# Patient Record
Sex: Female | Born: 1950 | Race: White | Hispanic: No | Marital: Married | Smoking: Former smoker
Health system: Southern US, Community
[De-identification: ages and names within clinical notes are randomized; demographics above are authoritative.]

## PROBLEM LIST (undated history)

## (undated) DIAGNOSIS — G43909 Migraine, unspecified, not intractable, without status migrainosus: Secondary | ICD-10-CM

## (undated) DIAGNOSIS — F32A Depression, unspecified: Secondary | ICD-10-CM

## (undated) DIAGNOSIS — I10 Essential (primary) hypertension: Secondary | ICD-10-CM

## (undated) DIAGNOSIS — Z8711 Personal history of peptic ulcer disease: Secondary | ICD-10-CM

## (undated) DIAGNOSIS — F329 Major depressive disorder, single episode, unspecified: Secondary | ICD-10-CM

## (undated) DIAGNOSIS — Z8719 Personal history of other diseases of the digestive system: Secondary | ICD-10-CM

## (undated) DIAGNOSIS — I639 Cerebral infarction, unspecified: Secondary | ICD-10-CM

## (undated) DIAGNOSIS — M1990 Unspecified osteoarthritis, unspecified site: Secondary | ICD-10-CM

## (undated) HISTORY — PX: ABDOMINAL HYSTERECTOMY: SHX81

---

## 2016-08-17 ENCOUNTER — Encounter (HOSPITAL_BASED_OUTPATIENT_CLINIC_OR_DEPARTMENT_OTHER): Payer: Self-pay | Admitting: Emergency Medicine

## 2016-08-17 ENCOUNTER — Emergency Department (HOSPITAL_BASED_OUTPATIENT_CLINIC_OR_DEPARTMENT_OTHER): Payer: Medicare HMO

## 2016-08-17 ENCOUNTER — Emergency Department (HOSPITAL_BASED_OUTPATIENT_CLINIC_OR_DEPARTMENT_OTHER)
Admission: EM | Admit: 2016-08-17 | Discharge: 2016-08-18 | Disposition: A | Discharge status: Home / Self Care | Payer: Medicare HMO | Attending: Emergency Medicine | Admitting: Emergency Medicine

## 2016-08-17 DIAGNOSIS — R109 Unspecified abdominal pain: Secondary | ICD-10-CM | POA: Diagnosis present

## 2016-08-17 DIAGNOSIS — E876 Hypokalemia: Secondary | ICD-10-CM | POA: Diagnosis not present

## 2016-08-17 DIAGNOSIS — Z87891 Personal history of nicotine dependence: Secondary | ICD-10-CM | POA: Insufficient documentation

## 2016-08-17 DIAGNOSIS — K5900 Constipation, unspecified: Secondary | ICD-10-CM | POA: Insufficient documentation

## 2016-08-17 DIAGNOSIS — E860 Dehydration: Secondary | ICD-10-CM | POA: Diagnosis not present

## 2016-08-17 DIAGNOSIS — I10 Essential (primary) hypertension: Secondary | ICD-10-CM | POA: Insufficient documentation

## 2016-08-17 DIAGNOSIS — Z7982 Long term (current) use of aspirin: Secondary | ICD-10-CM | POA: Diagnosis not present

## 2016-08-17 DIAGNOSIS — R1010 Upper abdominal pain, unspecified: Secondary | ICD-10-CM

## 2016-08-17 DIAGNOSIS — Z79899 Other long term (current) drug therapy: Secondary | ICD-10-CM | POA: Insufficient documentation

## 2016-08-17 HISTORY — DX: Unspecified osteoarthritis, unspecified site: M19.90

## 2016-08-17 HISTORY — DX: Migraine, unspecified, not intractable, without status migrainosus: G43.909

## 2016-08-17 HISTORY — DX: Depression, unspecified: F32.A

## 2016-08-17 HISTORY — DX: Cerebral infarction, unspecified: I63.9

## 2016-08-17 HISTORY — DX: Personal history of peptic ulcer disease: Z87.11

## 2016-08-17 HISTORY — DX: Essential (primary) hypertension: I10

## 2016-08-17 HISTORY — DX: Personal history of other diseases of the digestive system: Z87.19

## 2016-08-17 HISTORY — DX: Major depressive disorder, single episode, unspecified: F32.9

## 2016-08-17 LAB — CBC WITH DIFFERENTIAL/PLATELET
BASOS ABS: 0 10*3/uL (ref 0.0–0.1)
Basophils Relative: 0 %
EOS PCT: 4 %
Eosinophils Absolute: 0.2 10*3/uL (ref 0.0–0.7)
HCT: 36 % (ref 36.0–46.0)
Hemoglobin: 12.1 g/dL (ref 12.0–15.0)
LYMPHS PCT: 26 %
Lymphs Abs: 1.7 10*3/uL (ref 0.7–4.0)
MCH: 32.9 pg (ref 26.0–34.0)
MCHC: 33.6 g/dL (ref 30.0–36.0)
MCV: 97.8 fL (ref 78.0–100.0)
Monocytes Absolute: 0.7 10*3/uL (ref 0.1–1.0)
Monocytes Relative: 11 %
NEUTROS PCT: 59 %
Neutro Abs: 3.8 10*3/uL (ref 1.7–7.7)
PLATELETS: 211 10*3/uL (ref 150–400)
RBC: 3.68 MIL/uL — AB (ref 3.87–5.11)
RDW: 13.2 % (ref 11.5–15.5)
WBC: 6.4 10*3/uL (ref 4.0–10.5)

## 2016-08-17 LAB — COMPREHENSIVE METABOLIC PANEL
ALT: 26 U/L (ref 14–54)
AST: 25 U/L (ref 15–41)
Albumin: 3.6 g/dL (ref 3.5–5.0)
Alkaline Phosphatase: 55 U/L (ref 38–126)
Anion gap: 8 (ref 5–15)
BUN: 35 mg/dL — ABNORMAL HIGH (ref 6–20)
CHLORIDE: 105 mmol/L (ref 101–111)
CO2: 23 mmol/L (ref 22–32)
CREATININE: 1.79 mg/dL — AB (ref 0.44–1.00)
Calcium: 8.7 mg/dL — ABNORMAL LOW (ref 8.9–10.3)
GFR calc Af Amer: 33 mL/min — ABNORMAL LOW (ref >60)
GFR, EST NON AFRICAN AMERICAN: 28 mL/min — AB (ref >60)
Glucose, Bld: 97 mg/dL (ref 65–99)
Potassium: 3.1 mmol/L — ABNORMAL LOW (ref 3.5–5.1)
SODIUM: 136 mmol/L (ref 135–145)
Total Bilirubin: 0.4 mg/dL (ref 0.3–1.2)
Total Protein: 6.5 g/dL (ref 6.5–8.1)

## 2016-08-17 LAB — URINALYSIS, ROUTINE W REFLEX MICROSCOPIC
Bilirubin Urine: NEGATIVE
Glucose, UA: NEGATIVE mg/dL
HGB URINE DIPSTICK: NEGATIVE
Ketones, ur: NEGATIVE mg/dL
LEUKOCYTES UA: NEGATIVE
Nitrite: NEGATIVE
PROTEIN: NEGATIVE mg/dL
Specific Gravity, Urine: 1.02 (ref 1.005–1.030)
pH: 6 (ref 5.0–8.0)

## 2016-08-17 LAB — LIPASE, BLOOD: Lipase: 29 U/L (ref 11–51)

## 2016-08-17 MED ORDER — POTASSIUM CHLORIDE CRYS ER 20 MEQ PO TBCR
40.0000 meq | EXTENDED_RELEASE_TABLET | Freq: Once | ORAL | Status: AC
  Administered 2016-08-17: 40 meq via ORAL
  Filled 2016-08-17: qty 2

## 2016-08-17 MED ORDER — DICYCLOMINE HCL 10 MG PO CAPS
10.0000 mg | ORAL_CAPSULE | Freq: Once | ORAL | Status: AC
  Administered 2016-08-17: 10 mg via ORAL
  Filled 2016-08-17: qty 1

## 2016-08-17 MED ORDER — MORPHINE SULFATE (PF) 4 MG/ML IV SOLN
4.0000 mg | Freq: Once | INTRAVENOUS | Status: AC
  Administered 2016-08-17: 4 mg via INTRAVENOUS
  Filled 2016-08-17: qty 1

## 2016-08-17 MED ORDER — SODIUM CHLORIDE 0.9 % IV BOLUS (SEPSIS)
500.0000 mL | Freq: Once | INTRAVENOUS | Status: AC
  Administered 2016-08-17: 500 mL via INTRAVENOUS

## 2016-08-17 MED ORDER — POTASSIUM CHLORIDE 10 MEQ/100ML IV SOLN
10.0000 meq | INTRAVENOUS | Status: AC
  Administered 2016-08-17 (×2): 10 meq via INTRAVENOUS
  Filled 2016-08-17 (×2): qty 100

## 2016-08-17 MED ORDER — DICYCLOMINE HCL 20 MG PO TABS: 20.0000 mg | ORAL_TABLET | Freq: Two times a day (BID) | ORAL | 0 refills | Status: AC | PRN

## 2016-08-17 MED ORDER — POLYETHYLENE GLYCOL 3350 17 G PO PACK: 17.0000 g | PACK | Freq: Every day | ORAL | 0 refills | Status: AC

## 2016-08-17 MED ORDER — DOCUSATE SODIUM 100 MG PO CAPS: 100.0000 mg | ORAL_CAPSULE | Freq: Two times a day (BID) | ORAL | 0 refills | Status: AC | PRN

## 2016-08-17 NOTE — ED Triage Notes (Signed)
LLQ abd pain radiating to back since yesterday. Denies N/V. Pt states she woke up with blood in her mouth yesterday.

## 2016-08-17 NOTE — ED Provider Notes (Signed)
[facility]-EMERGENCY DEPT Provider Note   CSN: [id number] Arrival date & time: 08/17/16  2023  By signing my name below, I, Catherine Weiss, attest that this documentation has been prepared under the direction and in the presence of Loren Racer, MD. Electronically Signed: Sonum Weiss, Neurosurgeon. 08/17/16. 10:05 PM.  History   Chief Complaint Chief Complaint  Patient presents with  . Abdominal Pain    The history is provided by the patient. No language interpreter was used.    HPI Comments: Catherine Weiss is a 66 y.o. female who presents to the Emergency Department complaining of constant left sided abdominal pain with radiation to the left Flank that began yesterday around noon. She had associated subjective fever and chills that began yesterday. She notes bruising to the lower back area and had a small amount of blood in her mouth upon waking yesterday morning. She notes having blood in her mouth about 1 month ago as well. She also reports pain to the left lower leg for the past few days. She currently takes Plavix. She denies nausea, vomiting, constipation, diarrhea, blood in stool, dysuria, hematuria, urinary frequency, leg swelling.  She has had an appendectomy and hysterectomy. She denies history of kidney stones.   Past Medical History:  Diagnosis Date  . Arthritis   . Depression   . History of stomach ulcers   . Hypertension   . Migraines   . Stroke [facility])     There are no active problems to display for this patient.   Past Surgical History:  Procedure Laterality Date  . ABDOMINAL HYSTERECTOMY      OB History    No data available       Home Medications    Prior to Admission medications   Medication Sig Start Date End Date Taking? Authorizing Provider  aspirin EC 81 MG tablet Take 81 mg by mouth daily.   Yes Historical Provider, MD  clopidogrel (PLAVIX) 75 MG tablet Take 75 mg by mouth daily.   Yes Historical Provider, MD  PARoxetine (PAXIL) 20 MG tablet Take 20 mg by  mouth daily.   Yes Historical Provider, MD  topiramate (TOPAMAX) 100 MG tablet Take 100 mg by mouth 2 (two) times daily.   Yes Historical Provider, MD  dicyclomine (BENTYL) 20 MG tablet Take 1 tablet (20 mg total) by mouth 2 (two) times daily as needed for spasms. 08/17/16   Loren Racer, MD  docusate sodium (COLACE) 100 MG capsule Take 1 capsule (100 mg total) by mouth 2 (two) times daily as needed for mild constipation or moderate constipation. 08/17/16   Loren Racer, MD  polyethylene glycol [facility]) packet Take 17 g by mouth daily. 08/17/16   Loren Racer, MD    Family History No family history on file.  Social History Social History  Substance Use Topics  . Smoking status: Former Games developer  . Smokeless tobacco: Never Used  . Alcohol use Not on file     Allergies   Penicillins   Review of Systems Review of Systems  Constitutional: Positive for chills and fever.  Respiratory: Negative for cough and shortness of breath.   Cardiovascular: Negative for chest pain, palpitations and leg swelling.  Gastrointestinal: Positive for abdominal pain. Negative for blood in stool, constipation, diarrhea, nausea and vomiting.  Genitourinary: Positive for flank pain. Negative for dysuria, frequency and hematuria.  Musculoskeletal: Positive for back pain and myalgias.  Skin: Positive for color change (bruising). Negative for rash and wound.  Neurological: Negative for dizziness,  weakness, light-headedness, numbness and headaches.  All other systems reviewed and are negative.    Physical Exam Updated Vital Signs BP (!) 148/73 (BP Location: Right Arm)   Pulse 87   Temp 97.5 F (36.4 C) (Axillary)   Resp 16   SpO2 97%   Physical Exam  Constitutional: She is oriented to person, place, and time. She appears well-developed and well-nourished. She appears distressed.  HENT:  Head: Normocephalic and atraumatic.  Mouth/Throat: Oropharynx is clear and moist. No oropharyngeal  exudate.  Eyes: EOM are normal. Pupils are equal, round, and reactive to light.  Neck: Normal range of motion. Neck supple.  Cardiovascular: Normal rate and regular rhythm.   Pulmonary/Chest: Effort normal and breath sounds normal. No respiratory distress. She has no wheezes. She has no rales. She exhibits no tenderness.  Abdominal: Soft. Bowel sounds are normal. She exhibits distension. She exhibits no mass. There is tenderness (Diffuse abdominal tenderness to palpation which appears most pronounced in the epigastrium and left upper quadrant. No rebound or guarding. Mild generalized distention.). There is no rebound and no guarding. No hernia.  Musculoskeletal: Normal range of motion. She exhibits no edema or tenderness.  Left CVA tenderness to percussion. No midline thoracic or lumbar tenderness. No lower extremity swelling or asymmetry. 2+ distal pulses in all extremities.  Neurological: She is alert and oriented to person, place, and time.  5/5 motor in all extremities. Sensation fully intact.  Skin: Skin is warm and dry. Capillary refill takes less than 2 seconds. No rash noted. She is not diaphoretic. No erythema.  Psychiatric: She has a normal mood and affect. Her behavior is normal.  Nursing note and vitals reviewed.    ED Treatments / Results  DIAGNOSTIC STUDIES: Oxygen Saturation is 100% on RA, normal by my interpretation.    COORDINATION OF CARE: 9:43 PM Discussed treatment plan with pt at bedside and pt agreed to plan.   Labs (all labs ordered are listed, but only abnormal results are displayed) Labs Reviewed  CBC WITH DIFFERENTIAL/PLATELET - Abnormal; Notable for the following:       Result Value   RBC 3.68 (*)    All other components within normal limits  COMPREHENSIVE METABOLIC PANEL - Abnormal; Notable for the following:    Potassium 3.1 (*)    BUN 35 (*)    Creatinine, Ser 1.79 (*)    Calcium 8.7 (*)    GFR calc non Af Amer 28 (*)    GFR calc Af Amer 33 (*)     All other components within normal limits  URINALYSIS, ROUTINE W REFLEX MICROSCOPIC  LIPASE, BLOOD    EKG  EKG Interpretation  Date/Time:  Thursday August 17 2016 21:53:29 EDT Ventricular Rate:  85 PR Interval:    QRS Duration: 92 QT Interval:  377 QTC Calculation: 449 R Axis:   70 Text Interpretation:  Sinus rhythm Probable left atrial enlargement Confirmed by Ranae Palms  MD, Willman Cuny ([id number]) on 08/17/2016 10:14:21 PM Also confirmed by Ranae Palms  MD, Kendon Sedeno ([id number]), editor [place]  CCT, BEVERLY ([id number])  on 08/18/2016 6:46:40 AM       Radiology Ct Abdomen Pelvis Wo Contrast  Result Date: 08/17/2016 CLINICAL DATA:  Left lower abdominal and flank pain x2 days with fever and chills. Hysterectomy, stomach ulcers, hypertension and stroke history. EXAM: CT ABDOMEN AND PELVIS WITHOUT CONTRAST TECHNIQUE: Multidetector CT imaging of the abdomen and pelvis was performed following the standard protocol without IV contrast. COMPARISON:  None. FINDINGS: Lower chest: Dependent atelectasis. Heart  is normal in size. No pericardial effusion. Hepatobiliary: No focal liver abnormality is seen given the limitations of a noncontrast study. No gallstones, gallbladder wall thickening, or biliary dilatation. Pancreas: Unremarkable. No pancreatic ductal dilatation or surrounding inflammatory changes. Spleen: Normal in size without focal abnormality. Adrenals/Urinary Tract: Adrenal glands are unremarkable. Kidneys are normal, without renal calculi, focal lesion, or hydronephrosis. Bladder is unremarkable. Stomach/Bowel: Stomach is within normal limits. Appendix is not visualized however no pericecal inflammation is noted. There is a significant amount of stool throughout large bowel from cecum to rectum consistent with constipation. No evidence of bowel wall thickening, distention, or inflammatory changes. Vascular/Lymphatic: Aortic atherosclerosis. No enlarged abdominal or pelvic lymph nodes. Reproductive: Status post  hysterectomy. No adnexal masses. Other: No abdominal wall hernia or abnormality. No abdominopelvic ascites. Musculoskeletal: Lumbar spondylitic disc space narrowing from L1 through S1 with grade 1 anterolisthesis of L5 on S1 secondary to bilateral pars defects. IMPRESSION: 1. Increased colonic stool burden consistent with constipation. No inflammatory process or bowel obstruction noted. 2. No nephrolithiasis nor obstructive uropathy. 3. Status post hysterectomy.  No adnexal mass. 4. 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 Electronically Signed   By: Tollie Eth M.D.   On: 08/17/2016 23:24    Procedures Procedures (including critical care time)  Medications Ordered in ED Medications  sodium chloride 0.9 % bolus 500 mL (0 mLs Intravenous Stopped 08/17/16 2243)  morphine 4 MG/ML injection 4 mg (4 mg Intravenous Given 08/17/16 2202)  sodium chloride 0.9 % bolus 500 mL (0 mLs Intravenous Stopped 08/17/16 2352)  potassium chloride 10 mEq in 100 mL IVPB (0 mEq Intravenous Stopped 08/17/16 2352)  potassium chloride SA (K-DUR,KLOR-CON) CR tablet 40 mEq (40 mEq Oral Given 08/17/16 2344)  dicyclomine (BENTYL) capsule 10 mg (10 mg Oral Given 08/17/16 2347)  acetaminophen (TYLENOL) tablet 650 mg (650 mg Oral Given 08/18/16 0036)     Initial Impression / Assessment and Plan / ED Course  I have reviewed the triage vital signs and the nursing notes.  Pertinent labs & imaging results that were available during my care of the patient were reviewed by me and considered in my medical decision making (see chart for details).    Will start on bowel regimen for constipation. Given potassium replacement in the emergency department. Patient's baseline creatinine around 1.5. Mildly elevated over baseline. Possibly due to mild dehydration. Given IV fluids. Advised follow-up with primary physician.   Final Clinical Impressions(s) / ED Diagnoses   Final diagnoses:  Pain of upper abdomen  Constipation,  unspecified constipation type  Hypokalemia  Dehydration    New Prescriptions Discharge Medication List as of 08/17/2016 11:33 PM    START taking these medications   Details  dicyclomine (BENTYL) 20 MG tablet Take 1 tablet (20 mg total) by mouth 2 (two) times daily as needed for spasms., Starting Thu 08/17/2016, Print    docusate sodium (COLACE) 100 MG capsule Take 1 capsule (100 mg total) by mouth 2 (two) times daily as needed for mild constipation or moderate constipation., Starting Thu 08/17/2016, Print    polyethylene glycol (MIRALAX / GLYCOLAX) packet Take 17 g by mouth daily., Starting Thu 08/17/2016, Print       I personally performed the services described in this documentation, which was scribed in my presence. The recorded information has been reviewed and is accurate.      Loren Racer, MD 08/19/16 [phone]

## 2016-08-17 NOTE — ED Notes (Signed)
Pt. States no to nausea and vomiting.

## 2016-08-18 MED ORDER — ACETAMINOPHEN 325 MG PO TABS
ORAL_TABLET | ORAL | Status: AC
  Filled 2016-08-18: qty 2

## 2016-08-18 MED ORDER — ACETAMINOPHEN 325 MG PO TABS
650.0000 mg | ORAL_TABLET | Freq: Once | ORAL | Status: AC
  Administered 2016-08-18: 650 mg via ORAL

## 2016-12-13 DEATH — deceased

## 2017-02-08 ENCOUNTER — Emergency Department (HOSPITAL_BASED_OUTPATIENT_CLINIC_OR_DEPARTMENT_OTHER)
Admission: EM | Admit: 2017-02-08 | Discharge: 2017-02-08 | Disposition: A | Discharge status: Home / Self Care | Payer: Medicare HMO | Attending: Emergency Medicine | Admitting: Emergency Medicine

## 2017-02-08 ENCOUNTER — Encounter (HOSPITAL_BASED_OUTPATIENT_CLINIC_OR_DEPARTMENT_OTHER): Payer: Self-pay | Admitting: *Deleted

## 2017-02-08 ENCOUNTER — Emergency Department (HOSPITAL_BASED_OUTPATIENT_CLINIC_OR_DEPARTMENT_OTHER): Payer: Medicare HMO

## 2017-02-08 DIAGNOSIS — Z7902 Long term (current) use of antithrombotics/antiplatelets: Secondary | ICD-10-CM | POA: Insufficient documentation

## 2017-02-08 DIAGNOSIS — I10 Essential (primary) hypertension: Secondary | ICD-10-CM | POA: Diagnosis not present

## 2017-02-08 DIAGNOSIS — G44319 Acute post-traumatic headache, not intractable: Secondary | ICD-10-CM | POA: Diagnosis not present

## 2017-02-08 DIAGNOSIS — R55 Syncope and collapse: Secondary | ICD-10-CM | POA: Insufficient documentation

## 2017-02-08 DIAGNOSIS — Z87891 Personal history of nicotine dependence: Secondary | ICD-10-CM | POA: Insufficient documentation

## 2017-02-08 DIAGNOSIS — Z79899 Other long term (current) drug therapy: Secondary | ICD-10-CM | POA: Diagnosis not present

## 2017-02-08 DIAGNOSIS — R51 Headache: Secondary | ICD-10-CM | POA: Diagnosis present

## 2017-02-08 DIAGNOSIS — Z7982 Long term (current) use of aspirin: Secondary | ICD-10-CM | POA: Insufficient documentation

## 2017-02-08 LAB — URINALYSIS, ROUTINE W REFLEX MICROSCOPIC
Bilirubin Urine: NEGATIVE
GLUCOSE, UA: NEGATIVE mg/dL
HGB URINE DIPSTICK: NEGATIVE
Ketones, ur: NEGATIVE mg/dL
Leukocytes, UA: NEGATIVE
NITRITE: NEGATIVE
PROTEIN: NEGATIVE mg/dL
pH: 6 (ref 5.0–8.0)

## 2017-02-08 LAB — COMPREHENSIVE METABOLIC PANEL
ALT: 66 U/L — AB (ref 14–54)
AST: 90 U/L — ABNORMAL HIGH (ref 15–41)
Albumin: 3.2 g/dL — ABNORMAL LOW (ref 3.5–5.0)
Alkaline Phosphatase: 63 U/L (ref 38–126)
Anion gap: 11 (ref 5–15)
BUN: 37 mg/dL — ABNORMAL HIGH (ref 6–20)
CHLORIDE: 106 mmol/L (ref 101–111)
CO2: 22 mmol/L (ref 22–32)
CREATININE: 1.95 mg/dL — AB (ref 0.44–1.00)
Calcium: 8.8 mg/dL — ABNORMAL LOW (ref 8.9–10.3)
GFR calc Af Amer: 29 mL/min — ABNORMAL LOW (ref >60)
GFR, EST NON AFRICAN AMERICAN: 25 mL/min — AB (ref >60)
Glucose, Bld: 106 mg/dL — ABNORMAL HIGH (ref 65–99)
Potassium: 3 mmol/L — ABNORMAL LOW (ref 3.5–5.1)
Sodium: 139 mmol/L (ref 135–145)
Total Bilirubin: 0.3 mg/dL (ref 0.3–1.2)
Total Protein: 6.7 g/dL (ref 6.5–8.1)

## 2017-02-08 LAB — CBC WITH DIFFERENTIAL/PLATELET
Basophils Absolute: 0 10*3/uL (ref 0.0–0.1)
Basophils Relative: 0 %
EOS PCT: 2 %
Eosinophils Absolute: 0.1 10*3/uL (ref 0.0–0.7)
HCT: 37.8 % (ref 36.0–46.0)
Hemoglobin: 12.8 g/dL (ref 12.0–15.0)
LYMPHS ABS: 1.4 10*3/uL (ref 0.7–4.0)
LYMPHS PCT: 19 %
MCH: 32.5 pg (ref 26.0–34.0)
MCHC: 33.9 g/dL (ref 30.0–36.0)
MCV: 95.9 fL (ref 78.0–100.0)
MONO ABS: 0.7 10*3/uL (ref 0.1–1.0)
Monocytes Relative: 9 %
Neutro Abs: 5.2 10*3/uL (ref 1.7–7.7)
Neutrophils Relative %: 70 %
PLATELETS: 207 10*3/uL (ref 150–400)
RBC: 3.94 MIL/uL (ref 3.87–5.11)
RDW: 13.2 % (ref 11.5–15.5)
WBC: 7.4 10*3/uL (ref 4.0–10.5)

## 2017-02-08 MED ORDER — DIPHENHYDRAMINE HCL 50 MG/ML IJ SOLN
25.0000 mg | Freq: Once | INTRAMUSCULAR | Status: AC
  Administered 2017-02-08: 25 mg via INTRAVENOUS
  Filled 2017-02-08: qty 1

## 2017-02-08 MED ORDER — FENTANYL CITRATE (PF) 100 MCG/2ML IJ SOLN
100.0000 ug | Freq: Once | INTRAMUSCULAR | Status: AC
  Administered 2017-02-08: 100 ug via INTRAVENOUS
  Filled 2017-02-08: qty 2

## 2017-02-08 MED ORDER — HYDROCODONE-ACETAMINOPHEN 5-325 MG PO TABS: 1.0000 | ORAL_TABLET | ORAL | 0 refills | Status: AC | PRN

## 2017-02-08 MED ORDER — METOCLOPRAMIDE HCL 5 MG/ML IJ SOLN
10.0000 mg | Freq: Once | INTRAMUSCULAR | Status: AC
  Administered 2017-02-08: 10 mg via INTRAVENOUS
  Filled 2017-02-08: qty 2

## 2017-02-08 MED ORDER — BUPIVACAINE-EPINEPHRINE (PF) 0.5% -1:200000 IJ SOLN
1.8000 mL | Freq: Once | INTRAMUSCULAR | Status: AC
  Administered 2017-02-08: 1.8 mL
  Filled 2017-02-08: qty 1.8

## 2017-02-08 MED ORDER — FENTANYL CITRATE (PF) 100 MCG/2ML IJ SOLN
50.0000 ug | Freq: Once | INTRAMUSCULAR | Status: AC
  Administered 2017-02-08: 50 ug via INTRAVENOUS
  Filled 2017-02-08: qty 2

## 2017-02-08 NOTE — ED Notes (Signed)
Patient transported to CT 

## 2017-02-08 NOTE — ED Triage Notes (Signed)
Pt poor historian states that the best she can remember she has had blackout spells x a year dizziness 2-3 months. States that she has had several blackout spells in last week last one on Sunday when she fell and hit her head. Pt has had a HA since Sunday.

## 2017-02-08 NOTE — ED Provider Notes (Signed)
[facility] Provider Note: [name], MD, FACEP  CSN: [id number] MRN: [id number] ARRIVAL: 02/08/17 at 0109 ROOM: MH01/[facility]01   CHIEF COMPLAINT  Headache   HISTORY OF PRESENT ILLNESS  02/08/17 1:37 AM Catherine Weiss is a 66 y.o. female with a history of migraines and syncopal episodes. She has never had the syncopal episodes definitively diagnosed. She has had 4 syncopal episodes since 4 days ago, which is more frequently in usual. The syncopal episodes are preceded by a feeling of lightheadedness but no associated focal neurologic changes, chest pain, shortness of breath, palpitations, nausea or vomiting.   On her first fall 4 days ago she fell backwards and struck the back of her head. She has had a headache since then which she describes as originating from the occipital region but involves the entire head. It is different than her usual migraine pain. She rates it a 10 out of 10. She has had no relief with her usual home headache medications. She has had some occasional nausea but not persistent. She has had no vomiting. She denies any focal neurologic deficits. She has had photophobia.  Consultation with the [facility] state controlled substances database reveals the patient has received 2 prescriptions for hydrocodone in the past year, most recently in July.   Past Medical History:  Diagnosis Date  . Arthritis   . Depression   . History of stomach ulcers   . Hypertension   . Migraines   . Stroke [facility])     Past Surgical History:  Procedure Laterality Date  . ABDOMINAL HYSTERECTOMY      History reviewed. No pertinent family history.  Social History  Substance Use Topics  . Smoking status: Former Smoker    Quit date: 02/27/2014  . Smokeless tobacco: Never Used  . Alcohol use No    Prior to Admission medications   Medication Sig Start Date End Date Taking? Authorizing Provider  SUMAtriptan (IMITREX) 50 MG tablet Take 50 mg by mouth every 2 (two)  hours as needed for migraine. May repeat in 2 hours if headache persists or recurs.   Yes [provider]  aspirin EC 81 MG tablet Take 81 mg by mouth daily.    [provider]  clopidogrel (PLAVIX) 75 MG tablet Take 75 mg by mouth daily.    [provider]  dicyclomine (BENTYL) 20 MG tablet Take 1 tablet (20 mg total) by mouth 2 (two) times daily as needed for spasms. 08/17/16   Loren Racer, MD  docusate sodium (COLACE) 100 MG capsule Take 1 capsule (100 mg total) by mouth 2 (two) times daily as needed for mild constipation or moderate constipation. 08/17/16   Loren Racer, MD  PARoxetine (PAXIL) 20 MG tablet Take 20 mg by mouth daily.    [provider]  polyethylene glycol (MIRALAX / GLYCOLAX) packet Take 17 g by mouth daily. 08/17/16   Loren Racer, MD  topiramate (TOPAMAX) 100 MG tablet Take 100 mg by mouth 2 (two) times daily.    [provider]    Allergies Penicillins   REVIEW OF SYSTEMS  Negative except as noted here or in the History of Present Illness.   PHYSICAL EXAMINATION  Initial Vital Signs Blood pressure (!) 144/75, pulse 94, temperature 98.4 F (36.9 C), temperature source Oral, resp. rate 16, height  (1.575 m), weight 72.6 kg (160 lb), SpO2 100 %.  Examination General: Well-developed, well-nourished female in no acute distress; appearance consistent with age of record HENT: normocephalic;  occipital tenderness without palpable hematoma or step-off Eyes: pupils equal, round and reactive to light; extraocular muscles intact Neck: supple; no C-spine tenderness Heart: regular rate and rhythm Lungs: clear to auscultation bilaterally Abdomen: soft; nondistended; nontender; bowel sounds present Extremities: No deformity; full range of motion; pulses normal Neurologic: Awake, alert and oriented; motor function intact in all extremities and symmetric; no facial droop; normal coordination and speech Skin: Warm and  dry Psychiatric: Normal mood and affect   RESULTS  Summary of this visit's results, reviewed by myself:   EKG Interpretation  Date/Time:    Ventricular Rate:    PR Interval:    QRS Duration:   QT Interval:    QTC Calculation:   R Axis:     Text Interpretation:        Laboratory Studies: Results for orders placed or performed during the hospital encounter of 02/08/17 (from the past 24 hour(s))  CBC with Differential     Status: None   Collection Time: 02/08/17  1:45 AM  Result Value Ref Range   WBC 7.4 4.0 - 10.5 K/uL   RBC 3.94 3.87 - 5.11 MIL/uL   Hemoglobin 12.8 12.0 - 15.0 g/dL   HCT 16.1 09.6 - 04.5 %   MCV 95.9 78.0 - 100.0 fL   MCH 32.5 26.0 - 34.0 pg   MCHC 33.9 30.0 - 36.0 g/dL   RDW 40.9 81.1 - 91.4 %   Platelets 207 150 - 400 K/uL   Neutrophils Relative % 70 %   Neutro Abs 5.2 1.7 - 7.7 K/uL   Lymphocytes Relative 19 %   Lymphs Abs 1.4 0.7 - 4.0 K/uL   Monocytes Relative 9 %   Monocytes Absolute 0.7 0.1 - 1.0 K/uL   Eosinophils Relative 2 %   Eosinophils Absolute 0.1 0.0 - 0.7 K/uL   Basophils Relative 0 %   Basophils Absolute 0.0 0.0 - 0.1 K/uL  Comprehensive metabolic panel     Status: Abnormal   Collection Time: 02/08/17  1:45 AM  Result Value Ref Range   Sodium 139 135 - 145 mmol/L   Potassium 3.0 (L) 3.5 - 5.1 mmol/L   Chloride 106 101 - 111 mmol/L   CO2 22 22 - 32 mmol/L   Glucose, Bld 106 (H) 65 - 99 mg/dL   BUN 37 (H) 6 - 20 mg/dL   Creatinine, Ser 7.82 (H) 0.44 - 1.00 mg/dL   Calcium 8.8 (L) 8.9 - 10.3 mg/dL   Total Protein 6.7 6.5 - 8.1 g/dL   Albumin 3.2 (L) 3.5 - 5.0 g/dL   AST 90 (H) 15 - 41 U/L   ALT 66 (H) 14 - 54 U/L   Alkaline Phosphatase 63 38 - 126 U/L   Total Bilirubin 0.3 0.3 - 1.2 mg/dL   GFR calc non Af Amer 25 (L) >60 mL/min   GFR calc Af Amer 29 (L) >60 mL/min   Anion gap 11 5 - 15  Urinalysis, Routine w reflex microscopic     Status: Abnormal   Collection Time: 02/08/17  1:45 AM  Result Value Ref Range   Color,  Urine YELLOW YELLOW   APPearance CLEAR CLEAR   Specific Gravity, Urine <1.005 (L) 1.005 - 1.030   pH 6.0 5.0 - 8.0   Glucose, UA NEGATIVE NEGATIVE mg/dL   Hgb urine dipstick NEGATIVE NEGATIVE   Bilirubin Urine NEGATIVE NEGATIVE   Ketones, ur NEGATIVE NEGATIVE mg/dL   Protein, ur NEGATIVE NEGATIVE mg/dL   Nitrite NEGATIVE NEGATIVE  Leukocytes, UA NEGATIVE NEGATIVE   Imaging Studies: Ct Head Wo Contrast  Result Date: 02/08/2017 CLINICAL DATA:  66 y/o F; headache and multiple recent episodes of syncope. EXAM: CT HEAD WITHOUT CONTRAST TECHNIQUE: Contiguous axial images were obtained from the base of the skull through the vertex without intravenous contrast. COMPARISON:  07/25/2012 CT of head report. FINDINGS: Brain: No evidence of acute infarction, hemorrhage, hydrocephalus, extra-axial collection or mass lesion/mass effect. 2 mm focus of increased density within left frontal periventricular white matter described on 07/25/2012 CT of the head compatible with a nonspecific calcification. There are several foci of hypoattenuation predominantly within periventricular white matter and the basal ganglia compatible with chronic microvascular ischemic changes and chronic lacunar infarctions. Mild brain parenchymal volume loss. Vascular: Calcific atherosclerosis of carotid siphons. No hyperdense vessel. Skull: Normal. Negative for fracture or focal lesion. Sinuses/Orbits: No acute finding. Bilateral intra-ocular lens replacement. Other: None. IMPRESSION: 1. No acute intracranial abnormality identified. 2. Chronic microvascular ischemic changes and multiple chronic lacunar infarcts of basal ganglia, the central distribution suggests chronic hypertensive etiology. Mild brain parenchymal volume loss. Electronically Signed   By: Mitzi Hansen M.D.   On: 02/08/2017 02:08    ED COURSE  Nursing notes and initial vitals signs, including pulse oximetry, reviewed.  Vitals:   02/08/17 0118  BP: (!)  144/75  Pulse: 94  Resp: 16  Temp: 98.4 F (36.9 C)  TempSrc: Oral  SpO2: 100%  Weight: 72.6 kg (160 lb)  Height:  (1.575 m)   4:35 AM Pain significantly improved after additional dose of fentanyl. Patient amenable to going home. 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 She has an appointment scheduled today with a cardiologist and was advised to keep this appointment as she needs further evaluation of her syncopal episodes.   PROCEDURES   DENTAL BLOCK 1.8 milliliters of 0.5% bupivacaine with epinephrine were injected into the soft tissue of the posterior neck bilaterally approximately 1.5cm superior and 1.5cm lateral to the vertebra prominens. The patient tolerated this well and there were no immediate complications. Minimal analgesia was obtained.   ED DIAGNOSES     ICD-10-CM   1. Syncope and collapse R55   2. Acute post-traumatic headache, not intractable G44.319        Fanta Wimberley, Jonny Ruiz, MD 02/08/17 [phone]

## 2017-02-08 NOTE — ED Notes (Signed)
ED Provider at bedside. 

## 2019-03-16 DEATH — deceased

## 2019-04-11 ENCOUNTER — Other Ambulatory Visit: Payer: Self-pay

## 2019-04-11 ENCOUNTER — Emergency Department (HOSPITAL_BASED_OUTPATIENT_CLINIC_OR_DEPARTMENT_OTHER): Payer: Medicare HMO

## 2019-04-11 ENCOUNTER — Emergency Department (HOSPITAL_BASED_OUTPATIENT_CLINIC_OR_DEPARTMENT_OTHER)
Admission: EM | Admit: 2019-04-11 | Discharge: 2019-04-11 | Disposition: A | Discharge status: Home / Self Care | Payer: Medicare HMO | Attending: Emergency Medicine | Admitting: Emergency Medicine

## 2019-04-11 DIAGNOSIS — Z7982 Long term (current) use of aspirin: Secondary | ICD-10-CM | POA: Insufficient documentation

## 2019-04-11 DIAGNOSIS — Y92008 Other place in unspecified non-institutional (private) residence as the place of occurrence of the external cause: Secondary | ICD-10-CM | POA: Diagnosis not present

## 2019-04-11 DIAGNOSIS — S0990XA Unspecified injury of head, initial encounter: Secondary | ICD-10-CM

## 2019-04-11 DIAGNOSIS — Y93K9 Activity, other involving animal care: Secondary | ICD-10-CM | POA: Insufficient documentation

## 2019-04-11 DIAGNOSIS — S8002XA Contusion of left knee, initial encounter: Secondary | ICD-10-CM | POA: Diagnosis not present

## 2019-04-11 DIAGNOSIS — W01198A Fall on same level from slipping, tripping and stumbling with subsequent striking against other object, initial encounter: Secondary | ICD-10-CM | POA: Insufficient documentation

## 2019-04-11 DIAGNOSIS — Z7902 Long term (current) use of antithrombotics/antiplatelets: Secondary | ICD-10-CM | POA: Diagnosis not present

## 2019-04-11 DIAGNOSIS — Y999 Unspecified external cause status: Secondary | ICD-10-CM | POA: Diagnosis not present

## 2019-04-11 DIAGNOSIS — I10 Essential (primary) hypertension: Secondary | ICD-10-CM | POA: Diagnosis not present

## 2019-04-11 DIAGNOSIS — W19XXXA Unspecified fall, initial encounter: Secondary | ICD-10-CM

## 2019-04-11 DIAGNOSIS — S61411A Laceration without foreign body of right hand, initial encounter: Secondary | ICD-10-CM | POA: Diagnosis not present

## 2019-04-11 DIAGNOSIS — Z87891 Personal history of nicotine dependence: Secondary | ICD-10-CM | POA: Insufficient documentation

## 2019-04-11 DIAGNOSIS — Z79899 Other long term (current) drug therapy: Secondary | ICD-10-CM | POA: Diagnosis not present

## 2019-04-11 DIAGNOSIS — R030 Elevated blood-pressure reading, without diagnosis of hypertension: Secondary | ICD-10-CM

## 2019-04-11 MED ORDER — BACITRACIN ZINC 500 UNIT/GM EX OINT
TOPICAL_OINTMENT | Freq: Once | CUTANEOUS | Status: AC
  Administered 2019-04-11: 1 via TOPICAL

## 2019-04-11 MED ORDER — LIDOCAINE-EPINEPHRINE-TETRACAINE (LET) TOPICAL GEL
3.0000 mL | Freq: Once | TOPICAL | Status: AC
  Administered 2019-04-11: 3 mL via TOPICAL
  Filled 2019-04-11: qty 3

## 2019-04-11 MED ORDER — ONDANSETRON 4 MG PO TBDP
4.0000 mg | ORAL_TABLET | Freq: Once | ORAL | Status: AC
  Administered 2019-04-11: 4 mg via ORAL
  Filled 2019-04-11: qty 1

## 2019-04-11 MED ORDER — LIDOCAINE HCL (PF) 1 % IJ SOLN
5.0000 mL | Freq: Once | INTRAMUSCULAR | Status: AC
  Administered 2019-04-11: 5 mL
  Filled 2019-04-11: qty 5

## 2019-04-11 MED ORDER — OXYCODONE-ACETAMINOPHEN 5-325 MG PO TABS
1.0000 | ORAL_TABLET | Freq: Once | ORAL | Status: AC
  Administered 2019-04-11: 1 via ORAL
  Filled 2019-04-11: qty 1

## 2019-04-11 MED ORDER — POLYMYXIN B-TRIMETHOPRIM 10000-0.1 UNIT/ML-% OP SOLN: 1.0000 [drp] | OPHTHALMIC | 0 refills | Status: AC

## 2019-04-11 NOTE — ED Notes (Signed)
Pt was given a gingerale.

## 2019-04-11 NOTE — ED Provider Notes (Signed)
[facility]PART[facility] Provider Note   CSN: [id number] Arrival date & time: 04/11/19  2007     History   Chief Complaint Chief Complaint  Patient presents with  . Fall    HPI Rickelle Sylvestre is a 68 y.o. female.     Patient is a 68 year old female who presents after a fall.  She was trying to get her dogs back inside and fell on some gravel out in her garage.  She denies any dizziness, chest pain, shortness of breath or other symptoms prior to the fall.  It sounds like a mechanical fall.  She states she lost her balance but was not sure exactly what made her fall.  She did hit her head but there is no loss of consciousness.  She has a hematoma to her left forehead.  She also has some wounds on her right hand that she is concerned have gravel in them.  She has some pain to her left knee and left elbow.  She states her tetanus shot is up-to-date.  She denies any nausea or vomiting.  She does complain of a headache.  She is on Plavix and aspirin but no other anticoagulants.  She was seen in urgent care and sent here for further evaluation.     Past Medical History:  Diagnosis Date  . Arthritis   . Depression   . History of stomach ulcers   . Hypertension   . Migraines   . Stroke [facility])     There are no active problems to display for this patient.   Past Surgical History:  Procedure Laterality Date  . ABDOMINAL HYSTERECTOMY       OB History   No obstetric history on file.      Home Medications    Prior to Admission medications   Medication Sig Start Date End Date Taking? Authorizing Provider  aspirin EC 81 MG tablet Take 81 mg by mouth daily.    [provider]  clopidogrel (PLAVIX) 75 MG tablet Take 75 mg by mouth daily.    [provider]  dicyclomine (BENTYL) 20 MG tablet Take 1 tablet (20 mg total) by mouth 2 (two) times daily as needed for spasms. 08/17/16   Julianne Rice, MD  docusate sodium (COLACE) 100 MG capsule Take 1  capsule (100 mg total) by mouth 2 (two) times daily as needed for mild constipation or moderate constipation. 08/17/16   Julianne Rice, MD  HYDROcodone-acetaminophen (NORCO) 5-325 MG tablet Take 1 tablet by mouth every 4 (four) hours as needed for severe pain. 02/08/17   Molpus, John, MD  PARoxetine (PAXIL) 20 MG tablet Take 20 mg by mouth daily.    [provider]  polyethylene glycol (MIRALAX / GLYCOLAX) packet Take 17 g by mouth daily. 08/17/16   Julianne Rice, MD  SUMAtriptan (IMITREX) 50 MG tablet Take 50 mg by mouth every 2 (two) hours as needed for migraine. May repeat in 2 hours if headache persists or recurs.    [provider]  topiramate (TOPAMAX) 100 MG tablet Take 100 mg by mouth 2 (two) times daily.    [provider]  trimethoprim-polymyxin b (POLYTRIM) ophthalmic solution Place 1 drop into both eyes every 4 (four) hours. 04/11/19   Malvin Johns, MD    Family History No family history on file.  Social History Social History   Tobacco Use  . Smoking status: Former Smoker    Quit date: 02/27/2014    Years since quitting: 5.1  .  Smokeless tobacco: Never Used  Substance Use Topics  . Alcohol use: No  . Drug use: No     Allergies   Penicillins   Review of Systems Review of Systems  Constitutional: Negative for fever.  HENT: Negative for facial swelling.   Respiratory: Negative for shortness of breath.   Cardiovascular: Negative for chest pain.  Gastrointestinal: Negative for nausea and vomiting.  Musculoskeletal: Positive for arthralgias and joint swelling. Negative for back pain and neck pain.  Skin: Positive for wound.  Neurological: Positive for headaches. Negative for weakness, light-headedness and numbness.  All other systems reviewed and are negative.    Physical Exam Updated Vital Signs BP (!) 193/92   Pulse 81   Temp 98.7 F (37.1 C)   Resp 20   Ht 5\' 3"  (1.6 m)   Wt 68 kg   SpO2 100%   BMI 26.57 kg/m   Physical  Exam Constitutional:      Appearance: She is well-developed.  HENT:     Head: Normocephalic.     Comments: Mild hematoma to left forehead Eyes:     Pupils: Pupils are equal, round, and reactive to light.  Neck:     Comments: No pain to the cervical, thoracic or lumbosacral spine Cardiovascular:     Rate and Rhythm: Normal rate and regular rhythm.     Heart sounds: Normal heart sounds.  Pulmonary:     Effort: Pulmonary effort is normal. No respiratory distress.     Breath sounds: Normal breath sounds. No wheezing or rales.  Chest:     Chest wall: No tenderness.  Abdominal:     General: Bowel sounds are normal.     Palpations: Abdomen is soft.     Tenderness: There is no abdominal tenderness. There is no guarding or rebound.  Musculoskeletal:     Comments: Patient has 2 small lacerations to the base of the right hand at the volar surface.  There are some mild oozing from the wounds.  There is some tenderness to the base of the right thumb over the first metacarpal.  There is small abrasion over the left knee with some underlying bony tenderness.  There is mild pain on range of motion of the left hip.  There is a small abrasion to the left elbow with some underlying bony tenderness.  There is no other pain on palpation or range of motion of the extremities.  Lymphadenopathy:     Cervical: No cervical adenopathy.  Skin:    General: Skin is warm and dry.     Findings: No rash.  Neurological:     General: No focal deficit present.     Mental Status: She is alert and oriented to person, place, and time.      ED Treatments / Results  Labs (all labs ordered are listed, but only abnormal results are displayed) Labs Reviewed - No data to display  EKG None  Radiology Dg Elbow Complete Left  Result Date: 04/11/2019 CLINICAL DATA:  Pain after fall EXAM: LEFT ELBOW - COMPLETE 3+ VIEW COMPARISON:  None. FINDINGS: There is no evidence of fracture, dislocation, or joint effusion. There  is no evidence of arthropathy or other focal bone abnormality. Soft tissues are unremarkable. IMPRESSION: Negative. Electronically Signed   By: 04/13/2019 III M.D   On: 04/11/2019 21:28   Ct Head Wo Contrast  Result Date: 04/11/2019 CLINICAL DATA:  Acute pain due to trauma. EXAM: CT HEAD WITHOUT CONTRAST CT CERVICAL SPINE WITHOUT CONTRAST  TECHNIQUE: Multidetector CT imaging of the head and cervical spine was performed following the standard protocol without intravenous contrast. Multiplanar CT image reconstructions of the cervical spine were also generated. COMPARISON:  None. FINDINGS: CT HEAD FINDINGS Brain: No evidence of acute infarction, hemorrhage, hydrocephalus, extra-axial collection or mass lesion/mass effect. Chronic microvascular ischemic changes are noted. There is an old right basal ganglia lacunar infarct. Vascular: No hyperdense vessel or unexpected calcification. Skull: Normal. Negative for fracture or focal lesion. Sinuses/Orbits: No acute finding. Other: None. CT CERVICAL SPINE FINDINGS Alignment: There is straightening of the normal cervical lordotic curvature. Skull base and vertebrae: No acute fracture. No primary bone lesion or focal pathologic process. Soft tissues and spinal canal: No prevertebral fluid or swelling. No visible canal hematoma. Disc levels: Multilevel disc height loss is noted. There is multilevel osseous neural foraminal narrowing. Upper chest: Negative. Other: None IMPRESSION: 1. No acute intracranial abnormality. 2. No acute cervical spine fracture. Electronically Signed   By: Katherine Mantlehristopher  Green M.D.   On: 04/11/2019 21:29   Ct Cervical Spine Wo Contrast  Result Date: 04/11/2019 CLINICAL DATA:  Acute pain due to trauma. EXAM: CT HEAD WITHOUT CONTRAST CT CERVICAL SPINE WITHOUT CONTRAST TECHNIQUE: Multidetector CT imaging of the head and cervical spine was performed following the standard protocol without intravenous contrast. Multiplanar CT image reconstructions  of the cervical spine were also generated. COMPARISON:  None. FINDINGS: CT HEAD FINDINGS Brain: No evidence of acute infarction, hemorrhage, hydrocephalus, extra-axial collection or mass lesion/mass effect. Chronic microvascular ischemic changes are noted. There is an old right basal ganglia lacunar infarct. Vascular: No hyperdense vessel or unexpected calcification. Skull: Normal. Negative for fracture or focal lesion. Sinuses/Orbits: No acute finding. Other: None. CT CERVICAL SPINE FINDINGS Alignment: There is straightening of the normal cervical lordotic curvature. Skull base and vertebrae: No acute fracture. No primary bone lesion or focal pathologic process. Soft tissues and spinal canal: No prevertebral fluid or swelling. No visible canal hematoma. Disc levels: Multilevel disc height loss is noted. There is multilevel osseous neural foraminal narrowing. Upper chest: Negative. Other: None IMPRESSION: 1. No acute intracranial abnormality. 2. No acute cervical spine fracture. Electronically Signed   By: Katherine Mantlehristopher  Green M.D.   On: 04/11/2019 21:29   Dg Knee Complete 4 Views Left  Result Date: 04/11/2019 CLINICAL DATA:  Pain after fall EXAM: LEFT KNEE - COMPLETE 4+ VIEW COMPARISON:  None. FINDINGS: No evidence of fracture, dislocation, or joint effusion. No evidence of arthropathy or other focal bone abnormality. Soft tissues are unremarkable. IMPRESSION: Negative. Electronically Signed   By: Gerome Samavid  Williams III M.D   On: 04/11/2019 21:27   Dg Hand Complete Right  Result Date: 04/11/2019 CLINICAL DATA:  Home, she fall, hand pain EXAM: RIGHT HAND - COMPLETE 3+ VIEW COMPARISON:  04/29/2018 FINDINGS: Advanced osteoarthritis throughout the IP joints. Subluxation at the 2nd DIP joint and 4th PIP joint related to osteoarthritis. No acute fracture. IMPRESSION: Advanced osteoarthritis.  No acute fracture. Electronically Signed   By: Charlett NoseKevin  Dover M.D.   On: 04/11/2019 21:24   Dg Hip Unilat W Or Wo Pelvis 2-3  Views Left  Result Date: 04/11/2019 CLINICAL DATA:  Pain after fall EXAM: DG HIP (WITH OR WITHOUT PELVIS) 2-3V LEFT COMPARISON:  None. FINDINGS: There is no evidence of hip fracture or dislocation. There is no evidence of arthropathy or other focal bone abnormality. IMPRESSION: Negative. Electronically Signed   By: Gerome Samavid  Williams III M.D   On: 04/11/2019 21:25    Procedures Procedures (including  critical care time)  Medications Ordered in ED Medications  bacitracin ointment (has no administration in time range)  lidocaine-EPINEPHrine-tetracaine (LET) topical gel (3 mLs Topical Given 04/11/19 2129)  ondansetron (ZOFRAN-ODT) disintegrating tablet 4 mg (4 mg Oral Given 04/11/19 2129)  lidocaine (PF) (XYLOCAINE) 1 % injection 5 mL (5 mLs Infiltration Given 04/11/19 2149)  oxyCODONE-acetaminophen (PERCOCET/ROXICET) 5-325 MG per tablet 1 tablet (1 tablet Oral Given 04/11/19 2150)     Initial Impression / Assessment and Plan / ED Course  I have reviewed the triage vital signs and the nursing notes.  Pertinent labs & imaging results that were available during my care of the patient were reviewed by me and considered in my medical decision making (see chart for details).        Patient is a 68 year old female who presents after a fall.  It seems to be mechanical fall.  She did not have any symptoms preceding the event.  She has a headache and some nausea with a hematoma to her head.  CT scans of her head and cervical spine show no acute injuries.  She had some wounds on her right hand which appear to be skin tears.  They were cleaned and explored.  No foreign bodies were identified.  The skin was very thin overlying the avulsed areas and did not appear to be amenable to suturing.  It was fairly small, less than 1 cm V-shaped skin tears, and 2 in nature.  She had imaging studies that reveal no underlying fractures.  She had some soreness in her knee and had a knee sleeve placed.  She also  requested some antibiotic eyedrops for her eyes.  She reports that she has had some crusting in her eyes when she wakes up in the morning.  She has no vision changes.  I do not really appreciate any redness or crusting currently but will start her on Polytrim eyedrops.  Her blood pressure has been elevated although it has improved while she has been in the emergency department.  The last one I obtained was 184/90.  I advised her that she is in need to have her blood pressure rechecked by her primary care physician.  Return precautions were given.  Final Clinical Impressions(s) / ED Diagnoses   Final diagnoses:  Fall, initial encounter  Injury of head, initial encounter  Contusion of left knee, initial encounter  Skin tear of right hand without complication, initial encounter  Elevated blood pressure reading    ED Discharge Orders         Ordered    trimethoprim-polymyxin b (POLYTRIM) ophthalmic solution  Every 4 hours     04/11/19 2259           Rolan Bucco, MD 04/11/19 2303

## 2019-04-11 NOTE — Discharge Instructions (Signed)
Wash your wounds daily with soap and water.  Watch for any signs of infection including increased redness or drainage from the wound.  Return to the emergency room if you have any worsening headaches, vomiting, difficulty walking or other worsening symptoms.  You need to follow-up with your primary care physician to have your blood pressure rechecked.

## 2019-04-11 NOTE — ED Triage Notes (Signed)
Sent from urgent care for XRAYS, pt had a fall in her car port onto gravel chasing after some puppies who got lose. No LOC. Pt has hematoma to L knee, headache, abrasions and lacerations to R hand. Multiple bruises to legs that she attributes to puppies as well. Hypertensive in triage. Reports same at urgent care. Reports taking a goody powder PTA

## 2019-11-02 ENCOUNTER — Emergency Department (HOSPITAL_BASED_OUTPATIENT_CLINIC_OR_DEPARTMENT_OTHER): Payer: Medicare HMO

## 2019-11-02 ENCOUNTER — Encounter (HOSPITAL_BASED_OUTPATIENT_CLINIC_OR_DEPARTMENT_OTHER): Payer: Self-pay | Admitting: Emergency Medicine

## 2019-11-02 ENCOUNTER — Emergency Department (HOSPITAL_BASED_OUTPATIENT_CLINIC_OR_DEPARTMENT_OTHER)
Admission: EM | Admit: 2019-11-02 | Discharge: 2019-11-02 | Disposition: A | Discharge status: Home / Self Care | Payer: Medicare HMO | Attending: Emergency Medicine | Admitting: Emergency Medicine

## 2019-11-02 ENCOUNTER — Other Ambulatory Visit: Payer: Self-pay

## 2019-11-02 ENCOUNTER — Other Ambulatory Visit (HOSPITAL_BASED_OUTPATIENT_CLINIC_OR_DEPARTMENT_OTHER): Payer: Self-pay | Admitting: Radiology

## 2019-11-02 DIAGNOSIS — I10 Essential (primary) hypertension: Secondary | ICD-10-CM | POA: Insufficient documentation

## 2019-11-02 DIAGNOSIS — Y9389 Activity, other specified: Secondary | ICD-10-CM | POA: Diagnosis not present

## 2019-11-02 DIAGNOSIS — S0101XA Laceration without foreign body of scalp, initial encounter: Secondary | ICD-10-CM | POA: Diagnosis not present

## 2019-11-02 DIAGNOSIS — I639 Cerebral infarction, unspecified: Secondary | ICD-10-CM | POA: Diagnosis not present

## 2019-11-02 DIAGNOSIS — Z23 Encounter for immunization: Secondary | ICD-10-CM | POA: Diagnosis not present

## 2019-11-02 DIAGNOSIS — Y9289 Other specified places as the place of occurrence of the external cause: Secondary | ICD-10-CM | POA: Diagnosis not present

## 2019-11-02 DIAGNOSIS — Y998 Other external cause status: Secondary | ICD-10-CM | POA: Diagnosis not present

## 2019-11-02 DIAGNOSIS — Z7902 Long term (current) use of antithrombotics/antiplatelets: Secondary | ICD-10-CM | POA: Diagnosis not present

## 2019-11-02 DIAGNOSIS — W19XXXA Unspecified fall, initial encounter: Secondary | ICD-10-CM

## 2019-11-02 DIAGNOSIS — W01190A Fall on same level from slipping, tripping and stumbling with subsequent striking against furniture, initial encounter: Secondary | ICD-10-CM | POA: Diagnosis not present

## 2019-11-02 MED ORDER — HYDROCODONE-ACETAMINOPHEN 5-325 MG PO TABS
1.0000 | ORAL_TABLET | Freq: Once | ORAL | Status: AC
  Administered 2019-11-02: 15:00:00 1 via ORAL
  Filled 2019-11-02: qty 1

## 2019-11-02 MED ORDER — TETANUS-DIPHTH-ACELL PERTUSSIS 5-2.5-18.5 LF-MCG/0.5 IM SUSP
0.5000 mL | Freq: Once | INTRAMUSCULAR | Status: AC
  Administered 2019-11-02: 15:00:00 0.5 mL via INTRAMUSCULAR
  Filled 2019-11-02: qty 0.5

## 2019-11-02 MED ORDER — LIDOCAINE-EPINEPHRINE-TETRACAINE (LET) TOPICAL GEL
3.0000 mL | Freq: Once | TOPICAL | Status: AC
  Administered 2019-11-02: 15:00:00 3 mL via TOPICAL
  Filled 2019-11-02: qty 3

## 2019-11-02 NOTE — ED Provider Notes (Signed)
[facility] EMERGENCY DEPARTMENT Provider Note   CSN: [id number] Arrival date & time: 11/02/19  1420    History Chief Complaint  Patient presents with  . Fall   Catherine Weiss is a 69 y.o. female with past medical history significant for hypertension, migraines, CVA who presents for evaluation after mechanical fall.  Patient was standing behind her husband's recliner chair.  He laid the chair back and she subsequently fell backwards hitting her head on an open cabinet door.  She had laceration to her posterior scalp.  Denies LOC.  She currently takes Plavix for no additional anticoagulation.  She has been ambulatory since the incident.  No emesis.  Has a headache located over her laceration.  No vision changes.  Has some mild midline neck pain however denies see collar placement.  No pain with movement.  No chest pain, shortness of breath, loose dentition, abdominal pain, diarrhea, dysuria, pain, decreased range of motion to extremities, redness, swelling, warmth.  Last tetanus in 2014.  Denies additional aggravating relieving factors.  Rates her current head pain a 6/10.  Denies preceding sudden onset thunderclap headache, lightheadedness, dizziness, chest pain, weakness.  History obtained from patient and past medical records.  No interpreter is used.  HPI     Past Medical History:  Diagnosis Date  . Arthritis   . Depression   . History of stomach ulcers   . Hypertension   . Migraines   . Stroke [facility])     There are no problems to display for this patient.   Past Surgical History:  Procedure Laterality Date  . ABDOMINAL HYSTERECTOMY       OB History   No obstetric history on file.     No family history on file.  Social History   Tobacco Use  . Smoking status: Former Smoker    Quit date: 02/27/2014    Years since quitting: 5.6  . Smokeless tobacco: Never Used  Vaping Use  . Vaping Use: Never used  Substance Use Topics  . Alcohol use: No  . Drug use: No     Home Medications Prior to Admission medications   Medication Sig Start Date End Date Taking? Authorizing Provider  aspirin EC 81 MG tablet Take 81 mg by mouth daily.    [provider]  clopidogrel (PLAVIX) 75 MG tablet Take 75 mg by mouth daily.    [provider]  dicyclomine (BENTYL) 20 MG tablet Take 1 tablet (20 mg total) by mouth 2 (two) times daily as needed for spasms. 08/17/16   Loren Racer, MD  docusate sodium (COLACE) 100 MG capsule Take 1 capsule (100 mg total) by mouth 2 (two) times daily as needed for mild constipation or moderate constipation. 08/17/16   Loren Racer, MD  HYDROcodone-acetaminophen (NORCO) 5-325 MG tablet Take 1 tablet by mouth every 4 (four) hours as needed for severe pain. 02/08/17   Molpus, John, MD  PARoxetine (PAXIL) 20 MG tablet Take 20 mg by mouth daily.    [provider]  polyethylene glycol (MIRALAX / GLYCOLAX) packet Take 17 g by mouth daily. 08/17/16   Loren Racer, MD  SUMAtriptan (IMITREX) 50 MG tablet Take 50 mg by mouth every 2 (two) hours as needed for migraine. May repeat in 2 hours if headache persists or recurs.    [provider]  topiramate (TOPAMAX) 100 MG tablet Take 100 mg by mouth 2 (two) times daily.    [provider]  trimethoprim-polymyxin b (POLYTRIM) ophthalmic solution Place  1 drop into both eyes every 4 (four) hours. 04/11/19   Rolan Bucco, MD    Allergies    Penicillins  Review of Systems   Review of Systems  Constitutional: Negative.   HENT: Negative.   Respiratory: Negative.   Cardiovascular: Negative.   Gastrointestinal: Negative.   Genitourinary: Negative.   Musculoskeletal: Positive for neck pain. Negative for arthralgias, back pain, gait problem, joint swelling, myalgias and neck stiffness.  Skin: Positive for wound.  Neurological: Positive for headaches. Negative for dizziness, tremors, seizures, syncope, facial asymmetry, speech difficulty, weakness,  light-headedness and numbness.  All other systems reviewed and are negative.   Physical Exam Updated Vital Signs BP 113/68 (BP Location: Left Arm)   Pulse 80   Temp 98.1 F (36.7 C) (Oral)   Resp 16   Ht 5\' 3"  (1.6 m)   Wt 67.1 kg   SpO2 96%   BMI 26.22 kg/m   Physical Exam Vitals and nursing note reviewed.  Constitutional:      General: She is not in acute distress.    Appearance: She is well-developed. She is not ill-appearing or toxic-appearing.  HENT:     Head: Normocephalic. No raccoon eyes, Battle's sign, right periorbital erythema or left periorbital erythema.     Jaw: There is normal jaw occlusion.      Comments: 1 cm laceration located over posterior scalp.  No active bleeding or drainage.  Very minimal hematoma.  No drooling, dysphagia or trismus.  Able to open and close jaw without difficulty    Right Ear: No hemotympanum.     Left Ear: No hemotympanum.     Nose: Nose normal.     Comments: No facial tenderness, crepitus.  No septal hematoma    Mouth/Throat:     Lips: Pink.     Mouth: Mucous membranes are moist.     Pharynx: Oropharynx is clear.     Comments: No loose dentition.  No evidence of intraoral trauma Eyes:     Extraocular Movements: Extraocular movements intact.     Conjunctiva/sclera: Conjunctivae normal.     Pupils: Pupils are equal, round, and reactive to light.     Comments: EOMs intact, PERRLA  Neck:     Trachea: Trachea and phonation normal.      Comments: Mild tenderness of her posterior cervical spine without crepitus, step-offs.  Patient declined c-collar Cardiovascular:     Rate and Rhythm: Normal rate.     Pulses: Normal pulses.          Radial pulses are 2+ on the right side and 2+ on the left side.       Dorsalis pedis pulses are 2+ on the right side and 2+ on the left side.  Pulmonary:     Effort: Pulmonary effort is normal. No respiratory distress.     Breath sounds: Normal breath sounds and air entry.     Comments: Clear to  auscultation bilaterally.  Speaks in full sentences without difficulty. Chest:     Comments: No chest wall tenderness, crepitus or step-offs.  Equal rise and fall of chest Abdominal:     General: Bowel sounds are normal. There is no distension.     Palpations: Abdomen is soft.     Tenderness: There is no abdominal tenderness. There is no right CVA tenderness, left CVA tenderness or guarding.     Comments: Soft, nontender without rebound or guarding  Musculoskeletal:        General: Normal range of motion.  Cervical back: Normal range of motion. No edema, erythema, rigidity, torticollis or crepitus. Muscular tenderness present. No pain with movement. Normal range of motion.     Comments: No midline tenderness to thoracic or lumbar spine without crepitus or step-offs.  Pelvis stable, nontender palpation.  Moves all 4 extremities without difficulty.  Able to raise bilateral shoulders above head, flex and extend at all joints.  She is able to flex and extend at bilateral hips, knees.  Wiggles toes without difficulty.  Skin:    General: Skin is warm and dry.     Capillary Refill: Capillary refill takes less than 2 seconds.     Findings: Laceration present.     Comments: #2 -1 cm laceration over posterior scalp.  No active bleeding or drainage.  No additional contusions, abrasions or lacerations.  Neurological:     General: No focal deficit present.     Mental Status: She is alert.     Cranial Nerves: Cranial nerves are intact.     Sensory: Sensation is intact.     Motor: Motor function is intact.     Coordination: Coordination is intact.     Gait: Gait is intact.     Comments: Cn 2-12 grossly intact.  No facial droop.  Ambulatory with out difficulty.  5/5 strength bilateral upper and lower extremities without difficulty.     ED Results / Procedures / Treatments   Labs (all labs ordered are listed, but only abnormal results are displayed) Labs Reviewed - No data to  display  EKG None  Radiology CT Head Wo Contrast  Result Date: 11/02/2019 CLINICAL DATA:  Headache secondary to a fall with posterior head trauma and laceration of the scalp. EXAM: CT HEAD WITHOUT CONTRAST TECHNIQUE: Contiguous axial images were obtained from the base of the skull through the vertex without intravenous contrast. COMPARISON:  CT scan dated 04/11/2019 FINDINGS: Brain: No evidence of acute infarction, hemorrhage, hydrocephalus, extra-axial collection or mass lesion/mass effect. Extensive periventricular white matter lucency consistent with chronic small vessel ischemic disease, essentially unchanged. Vascular: No hyperdense vessel or unexpected calcification. Skull: Normal. Negative for fracture or focal lesion. Sinuses/Orbits: Normal. Other: Small scalp laceration posterior to the occipital bone in the midline. IMPRESSION: 1. No acute intracranial abnormality. Small scalp laceration posterior to the occipital bone in the midline. 2. Extensive chronic small vessel ischemic disease. Electronically Signed   By: Lorriane Shire M.D.   On: 11/02/2019 15:12   CT Cervical Spine Wo Contrast  Result Date: 11/02/2019 CLINICAL DATA:  Neck pain secondary to a fall with head trauma and scalp laceration. EXAM: CT CERVICAL SPINE WITHOUT CONTRAST TECHNIQUE: Multidetector CT imaging of the cervical spine was performed without intravenous contrast. Multiplanar CT image reconstructions were also generated. COMPARISON:  04/11/2019 FINDINGS: Alignment: Chronic 2.5 mm retrolisthesis of C3 on C4. Chronic 1 mm retrolisthesis of C4 on C5. Skull base and vertebrae: No acute fracture. No primary bone lesion or focal pathologic process. Soft tissues and spinal canal: No prevertebral fluid or swelling. No visible canal hematoma. Disc levels: C2-3: Slight disc space narrowing. No disc bulging or protrusion. Moderate left facet arthritis. No foraminal stenosis. C3-4: Severe degenerative disc disease with marked disc  space narrowing and 2.5 mm retrolisthesis. Broad-based endplate osteophyte compresses the ventral aspect of the thecal sac and encroaches on both lateral recesses and both neural foramina, left greater than right. Severe left foraminal stenosis. The findings are unchanged. C4-5: Severe chronic disc space narrowing. Endplate osteophytes asymmetric to the right with  slight right lateral recess encroachment moderately severe right foraminal stenosis, unchanged. C5-6: Severe chronic degenerative disc disease with disc space narrowing. Upper chest: No acute abnormality. Other: None IMPRESSION: 1. No acute abnormality of the cervical spine. No significant change since the prior study of 04/11/2019. 2. Severe chronic degenerative disc disease at C3-4 and C4-5 with severe left foraminal stenosis at C3-4. 3. Moderately severe right foraminal stenosis at C4-5. Electronically Signed   By: Francene Boyers M.D.   On: 11/02/2019 15:24    Procedures .Marland KitchenLaceration Repair  Date/Time: 11/02/2019 3:40 PM Performed by: Linwood Dibbles, PA-C Authorized by: Linwood Dibbles, PA-C   Consent:    Consent obtained:  Verbal   Consent given by:  Patient   Risks discussed:  Infection, need for additional repair, pain, poor cosmetic result and poor wound healing   Alternatives discussed:  No treatment and delayed treatment Universal protocol:    Procedure explained and questions answered to patient or proxy's satisfaction: yes     Relevant documents present and verified: yes     Test results available and properly labeled: yes     Imaging studies available: yes     Required blood products, implants, devices, and special equipment available: yes     Site/side marked: yes     Immediately prior to procedure, a time out was called: yes     Patient identity confirmed:  Verbally with patient Anesthesia (see MAR for exact dosages):    Anesthesia method:  Local infiltration and topical application   Topical anesthetic:   LET Laceration details:    Location:  Scalp   Scalp location:  Occipital   Length (cm):  1   Depth (mm):  3 Repair type:    Repair type:  Simple Pre-procedure details:    Preparation:  Patient was prepped and draped in usual sterile fashion and imaging obtained to evaluate for foreign bodies Exploration:    Hemostasis achieved with:  Direct pressure   Contaminated: no   Treatment:    Area cleansed with:  Betadine   Amount of cleaning:  Extensive   Irrigation solution:  Sterile saline   Irrigation method:  Pressure wash Skin repair:    Repair method:  Staples   Number of staples:  2 Approximation:    Approximation:  Close Post-procedure details:    Dressing:  Non-adherent dressing   Patient tolerance of procedure:  Tolerated well, no immediate complications .Marland KitchenLaceration Repair  Date/Time: 11/02/2019 3:41 PM Performed by: Linwood Dibbles, PA-C Authorized by: Linwood Dibbles, PA-C   Consent:    Consent obtained:  Verbal   Consent given by:  Patient   Risks discussed:  Infection, need for additional repair, pain, poor cosmetic result and poor wound healing   Alternatives discussed:  No treatment and delayed treatment Universal protocol:    Procedure explained and questions answered to patient or proxy's satisfaction: yes     Relevant documents present and verified: yes     Test results available and properly labeled: yes     Imaging studies available: yes     Required blood products, implants, devices, and special equipment available: yes     Site/side marked: yes     Immediately prior to procedure, a time out was called: yes     Patient identity confirmed:  Verbally with patient Anesthesia (see MAR for exact dosages):    Anesthesia method:  Local infiltration and topical application   Topical anesthetic:  LET Laceration details:  Location:  Scalp   Scalp location:  Occipital   Length (cm):  1   Depth (mm):  3 Repair type:    Repair type:   Simple Pre-procedure details:    Preparation:  Imaging obtained to evaluate for foreign bodies and patient was prepped and draped in usual sterile fashion Exploration:    Hemostasis achieved with:  Direct pressure   Wound exploration: wound explored through full range of motion and entire depth of wound probed and visualized     Contaminated: no   Treatment:    Area cleansed with:  Betadine   Amount of cleaning:  Extensive   Irrigation solution:  Sterile saline   Irrigation method:  Pressure wash Skin repair:    Repair method:  Staples   Number of staples:  2 Approximation:    Approximation:  Close Post-procedure details:    Dressing:  Non-adherent dressing   Patient tolerance of procedure:  Tolerated well, no immediate complications   (including critical care time)  Medications Ordered in ED Medications  Tdap (BOOSTRIX) injection 0.5 mL (0.5 mLs Intramuscular Given 11/02/19 1505)  lidocaine-EPINEPHrine-tetracaine (LET) topical gel (3 mLs Topical Given 11/02/19 1504)  HYDROcodone-acetaminophen (NORCO/VICODIN) 5-325 MG per tablet 1 tablet (1 tablet Oral Given 11/02/19 1519)    ED Course  I have reviewed the triage vital signs and the nursing notes.  Pertinent labs & imaging results that were available during my care of the patient were reviewed by me and considered in my medical decision making (see chart for details).  69 year old female presents for evaluation after mechanical fall.  Injury occurred just PTA.  She is currently on Plavix.  No preceding lightheadedness, dizziness, chest pain, shortness of breath, headache or weakness.  She has been ambulatory since the incident.  No hemotympanum, battle sign, raccoon eyes.  She does have some mild cervical tenderness without crepitus or step-offs.  She denies c-collar placement.  #2 1 cm lacerations to posterior scalp without active bleeding or drainage.  Last tetanus 2014, will update here in the ED.  No bony tenderness to  extremities.  Moves all 4 extremities at difficulty.  Normal musculoskeletal exam.  Neurovascularly intact.  Plan on imaging and reassess.  CT head with midline posterior lac without bleed, fracture CT cervical without acute changes  Patient reassessed. Laceration closed with total #4 staples. See procedure note. Tolerated well. Will follow up outpatient for removal. Tolerating PO intake without difficulty. Ambulatory without difficulty.  The patient has been appropriately medically screened and/or stabilized in the ED. I have low suspicion for any other emergent medical condition which would require further screening, evaluation or treatment in the ED or require inpatient management.  Patient is hemodynamically stable and in no acute distress.  Patient able to ambulate in department prior to ED.  Evaluation does not show acute pathology that would require ongoing or additional emergent interventions while in the emergency department or further inpatient treatment.  I have discussed the diagnosis with the patient and answered all questions.  Pain is been managed while in the emergency department and patient has no further complaints prior to discharge.  Patient is comfortable with plan discussed in room and is stable for discharge at this time.  I have discussed strict return precautions for returning to the emergency department.  Patient was encouraged to follow-up with PCP/specialist refer to at discharge.   MDM Rules/Calculators/A&P  Final Clinical Impression(s) / ED Diagnoses Final diagnoses:  Fall, initial encounter  Laceration of occipital region of scalp, initial encounter    Rx / DC Orders ED Discharge Orders    None       Roxie Kreeger A, PA-C 11/02/19 1543    Melene Plan, DO 11/05/19 [id number]

## 2019-11-02 NOTE — ED Triage Notes (Addendum)
Pt got knocked down when she walked behind her husbands recliner and he laid back. She hit her head on the table. Laceration to back of head. Denies LOC. She takes plavix.

## 2019-11-02 NOTE — Discharge Instructions (Signed)
Staples removed in 7 days.  Let warm soapy water run over this area.  If you have any vomiting, severe headache, passout please seek reevaluation emergency department.

## 2019-11-02 NOTE — ED Notes (Signed)
Patient transported to CT 

## 2019-11-02 NOTE — ED Notes (Signed)
ED Provider at bedside. 

## 2019-11-09 ENCOUNTER — Emergency Department (HOSPITAL_BASED_OUTPATIENT_CLINIC_OR_DEPARTMENT_OTHER)
Admission: EM | Admit: 2019-11-09 | Discharge: 2019-11-09 | Disposition: A | Discharge status: Home / Self Care | Payer: Medicare HMO | Attending: Emergency Medicine | Admitting: Emergency Medicine

## 2019-11-09 ENCOUNTER — Encounter (HOSPITAL_BASED_OUTPATIENT_CLINIC_OR_DEPARTMENT_OTHER): Payer: Self-pay | Admitting: Emergency Medicine

## 2019-11-09 ENCOUNTER — Other Ambulatory Visit: Payer: Self-pay

## 2019-11-09 DIAGNOSIS — Z880 Allergy status to penicillin: Secondary | ICD-10-CM | POA: Insufficient documentation

## 2019-11-09 DIAGNOSIS — S0990XD Unspecified injury of head, subsequent encounter: Secondary | ICD-10-CM | POA: Diagnosis present

## 2019-11-09 DIAGNOSIS — Z87891 Personal history of nicotine dependence: Secondary | ICD-10-CM | POA: Insufficient documentation

## 2019-11-09 DIAGNOSIS — W19XXXD Unspecified fall, subsequent encounter: Secondary | ICD-10-CM | POA: Insufficient documentation

## 2019-11-09 DIAGNOSIS — Z7982 Long term (current) use of aspirin: Secondary | ICD-10-CM | POA: Diagnosis not present

## 2019-11-09 DIAGNOSIS — S0191XD Laceration without foreign body of unspecified part of head, subsequent encounter: Secondary | ICD-10-CM | POA: Diagnosis not present

## 2019-11-09 DIAGNOSIS — Z4802 Encounter for removal of sutures: Secondary | ICD-10-CM

## 2019-11-09 DIAGNOSIS — Z79899 Other long term (current) drug therapy: Secondary | ICD-10-CM | POA: Insufficient documentation

## 2019-11-09 DIAGNOSIS — I10 Essential (primary) hypertension: Secondary | ICD-10-CM | POA: Insufficient documentation

## 2019-11-09 NOTE — Discharge Instructions (Signed)
Continue taking home medications as prescribed. Wash daily with soap and water.  Do not scrub at it vigorously, as this may cause bleeding. Return to the emergency room with any new, worsening, concerning symptoms.

## 2019-11-09 NOTE — ED Provider Notes (Signed)
[facility] EMERGENCY DEPARTMENT Provider Note   CSN: [id number] Arrival date & time: 11/09/19  1158     History Chief Complaint  Patient presents with  . Suture / Staple Removal    Catherine Weiss is a 69 y.o. female presenting for staple removal.  Patient states she is here to have the staples in her head removed.  She reports improving pain at the area.  No drainage from the area.  No new concerns.  Additional history came for chart review.  Patient was seen on 6/20 after fall, 4 sutures were placed on 2 separate lacerations.  She was seen in the office 5 days later for staple removal, but they were not ready to be removed yet.  HPI     Past Medical History:  Diagnosis Date  . Arthritis   . Depression   . History of stomach ulcers   . Hypertension   . Migraines   . Stroke [facility])     There are no problems to display for this patient.   Past Surgical History:  Procedure Laterality Date  . ABDOMINAL HYSTERECTOMY       OB History   No obstetric history on file.     No family history on file.  Social History   Tobacco Use  . Smoking status: Former Smoker    Quit date: 02/27/2014    Years since quitting: 5.7  . Smokeless tobacco: Never Used  Vaping Use  . Vaping Use: Never used  Substance Use Topics  . Alcohol use: No  . Drug use: No    Home Medications Prior to Admission medications   Medication Sig Start Date End Date Taking? Authorizing Provider  aspirin EC 81 MG tablet Take 81 mg by mouth daily.    [provider]  clopidogrel (PLAVIX) 75 MG tablet Take 75 mg by mouth daily.    [provider]  dicyclomine (BENTYL) 20 MG tablet Take 1 tablet (20 mg total) by mouth 2 (two) times daily as needed for spasms. 08/17/16   Julianne Rice, MD  docusate sodium (COLACE) 100 MG capsule Take 1 capsule (100 mg total) by mouth 2 (two) times daily as needed for mild constipation or moderate constipation. 08/17/16   Julianne Rice, MD    HYDROcodone-acetaminophen (NORCO) 5-325 MG tablet Take 1 tablet by mouth every 4 (four) hours as needed for severe pain. 02/08/17   Molpus, John, MD  PARoxetine (PAXIL) 20 MG tablet Take 20 mg by mouth daily.    [provider]  polyethylene glycol (MIRALAX / GLYCOLAX) packet Take 17 g by mouth daily. 08/17/16   Julianne Rice, MD  SUMAtriptan (IMITREX) 50 MG tablet Take 50 mg by mouth every 2 (two) hours as needed for migraine. May repeat in 2 hours if headache persists or recurs.    [provider]  topiramate (TOPAMAX) 100 MG tablet Take 100 mg by mouth 2 (two) times daily.    [provider]  trimethoprim-polymyxin b (POLYTRIM) ophthalmic solution Place 1 drop into both eyes every 4 (four) hours. 04/11/19   Malvin Johns, MD    Allergies    Penicillins  Review of Systems   Review of Systems  Skin: Positive for wound.  Neurological: Negative for headaches.    Physical Exam Updated Vital Signs BP 101/60 (BP Location: Left Arm)   Pulse 79   Temp 98 F (36.7 C) (Oral)   Resp 16   SpO2 99%   Physical Exam Vitals and nursing note reviewed.  Constitutional:      General: She is not in acute distress.    Appearance: She is well-developed.  HENT:     Head: Normocephalic and atraumatic.     Comments: Well healed lacerations of the occiput without bleeding or drainage. Staples present Pulmonary:     Effort: Pulmonary effort is normal.  Abdominal:     General: There is no distension.  Musculoskeletal:        General: Normal range of motion.     Cervical back: Normal range of motion.  Skin:    General: Skin is warm.     Findings: No rash.  Neurological:     Mental Status: She is alert and oriented to person, place, and time.     ED Results / Procedures / Treatments   Labs (all labs ordered are listed, but only abnormal results are displayed) Labs Reviewed - No data to display  EKG None  Radiology No results found.  Procedures .Suture  Removal  Date/Time: 11/09/2019 3:03 PM Performed by: Alveria Apley, PA-C Authorized by: Alveria Apley, PA-C   Consent:    Consent obtained:  Verbal   Consent given by:  Patient   Risks discussed:  Bleeding, pain and wound separation Location:    Location:  Head/neck   Head/neck location:  Scalp Procedure details:    Wound appearance:  Nonpurulent, good wound healing and no signs of infection   Number of staples removed:  4 Post-procedure details:    Post-removal:  No dressing applied   Patient tolerance of procedure:  Tolerated well, no immediate complications   (including critical care time)  Medications Ordered in ED Medications - No data to display  ED Course  I have reviewed the triage vital signs and the nursing notes.  Pertinent labs & imaging results that were available during my care of the patient were reviewed by me and considered in my medical decision making (see chart for details).    MDM Rules/Calculators/A&P                          Patient presenting for staple removal.  On exam, wound appears to be healing well.  No drainage.  Staples removed as described above.  No dehiscence or sign of wound separation.  Discussed aftercare instructions.  At this time, patient appears safe for discharge.  Return precautions given.  Patient states she understands and agrees to plan.  Final Clinical Impression(s) / ED Diagnoses Final diagnoses:  Encounter for staple removal    Rx / DC Orders ED Discharge Orders    None       Alveria Apley, PA-C 11/09/19 1504    Virgina Norfolk, DO 11/09/19 1509

## 2019-11-09 NOTE — ED Triage Notes (Signed)
Pt here for staple removal from her head.

## 2019-11-11 ENCOUNTER — Emergency Department (HOSPITAL_BASED_OUTPATIENT_CLINIC_OR_DEPARTMENT_OTHER)
Admission: EM | Admit: 2019-11-11 | Discharge: 2019-11-11 | Disposition: A | Discharge status: Home / Self Care | Payer: Medicare HMO | Attending: Emergency Medicine | Admitting: Emergency Medicine

## 2019-11-11 ENCOUNTER — Other Ambulatory Visit: Payer: Self-pay

## 2019-11-11 ENCOUNTER — Encounter (HOSPITAL_BASED_OUTPATIENT_CLINIC_OR_DEPARTMENT_OTHER): Payer: Self-pay | Admitting: Emergency Medicine

## 2019-11-11 ENCOUNTER — Emergency Department (HOSPITAL_BASED_OUTPATIENT_CLINIC_OR_DEPARTMENT_OTHER): Payer: Medicare HMO

## 2019-11-11 DIAGNOSIS — Y929 Unspecified place or not applicable: Secondary | ICD-10-CM | POA: Insufficient documentation

## 2019-11-11 DIAGNOSIS — R2689 Other abnormalities of gait and mobility: Secondary | ICD-10-CM | POA: Diagnosis not present

## 2019-11-11 DIAGNOSIS — S82831A Other fracture of upper and lower end of right fibula, initial encounter for closed fracture: Secondary | ICD-10-CM | POA: Insufficient documentation

## 2019-11-11 DIAGNOSIS — W19XXXA Unspecified fall, initial encounter: Secondary | ICD-10-CM

## 2019-11-11 DIAGNOSIS — R42 Dizziness and giddiness: Secondary | ICD-10-CM

## 2019-11-11 DIAGNOSIS — M2550 Pain in unspecified joint: Secondary | ICD-10-CM | POA: Insufficient documentation

## 2019-11-11 DIAGNOSIS — S0990XA Unspecified injury of head, initial encounter: Secondary | ICD-10-CM | POA: Diagnosis present

## 2019-11-11 DIAGNOSIS — Z880 Allergy status to penicillin: Secondary | ICD-10-CM | POA: Diagnosis not present

## 2019-11-11 DIAGNOSIS — Y999 Unspecified external cause status: Secondary | ICD-10-CM | POA: Insufficient documentation

## 2019-11-11 DIAGNOSIS — Y939 Activity, unspecified: Secondary | ICD-10-CM | POA: Insufficient documentation

## 2019-11-11 DIAGNOSIS — I10 Essential (primary) hypertension: Secondary | ICD-10-CM | POA: Insufficient documentation

## 2019-11-11 DIAGNOSIS — W1830XA Fall on same level, unspecified, initial encounter: Secondary | ICD-10-CM | POA: Insufficient documentation

## 2019-11-11 LAB — CBC WITH DIFFERENTIAL/PLATELET
Abs Immature Granulocytes: 0.02 10*3/uL (ref 0.00–0.07)
Basophils Absolute: 0.1 10*3/uL (ref 0.0–0.1)
Basophils Relative: 1 %
Eosinophils Absolute: 0.4 10*3/uL (ref 0.0–0.5)
Eosinophils Relative: 6 %
HCT: 37.8 % (ref 36.0–46.0)
Hemoglobin: 11.9 g/dL — ABNORMAL LOW (ref 12.0–15.0)
Immature Granulocytes: 0 %
Lymphocytes Relative: 25 %
Lymphs Abs: 1.7 10*3/uL (ref 0.7–4.0)
MCH: 32.1 pg (ref 26.0–34.0)
MCHC: 31.5 g/dL (ref 30.0–36.0)
MCV: 101.9 fL — ABNORMAL HIGH (ref 80.0–100.0)
Monocytes Absolute: 0.7 10*3/uL (ref 0.1–1.0)
Monocytes Relative: 10 %
Neutro Abs: 4 10*3/uL (ref 1.7–7.7)
Neutrophils Relative %: 58 %
Platelets: 224 10*3/uL (ref 150–400)
RBC: 3.71 MIL/uL — ABNORMAL LOW (ref 3.87–5.11)
RDW: 13.4 % (ref 11.5–15.5)
WBC: 6.9 10*3/uL (ref 4.0–10.5)
nRBC: 0 % (ref 0.0–0.2)

## 2019-11-11 LAB — BASIC METABOLIC PANEL
Anion gap: 11 (ref 5–15)
BUN: 39 mg/dL — ABNORMAL HIGH (ref 8–23)
CO2: 24 mmol/L (ref 22–32)
Calcium: 9.1 mg/dL (ref 8.9–10.3)
Chloride: 102 mmol/L (ref 98–111)
Creatinine, Ser: 1.45 mg/dL — ABNORMAL HIGH (ref 0.44–1.00)
GFR calc Af Amer: 42 mL/min — ABNORMAL LOW (ref >60)
GFR calc non Af Amer: 36 mL/min — ABNORMAL LOW (ref >60)
Glucose, Bld: 87 mg/dL (ref 70–99)
Potassium: 4.4 mmol/L (ref 3.5–5.1)
Sodium: 137 mmol/L (ref 135–145)

## 2019-11-11 MED ORDER — SODIUM CHLORIDE 0.9 % IV BOLUS
1000.0000 mL | Freq: Once | INTRAVENOUS | Status: AC
  Administered 2019-11-11: 1000 mL via INTRAVENOUS

## 2019-11-11 MED ORDER — OXYCODONE-ACETAMINOPHEN 5-325 MG PO TABS
1.0000 | ORAL_TABLET | Freq: Once | ORAL | Status: AC
  Administered 2019-11-11: 1 via ORAL
  Filled 2019-11-11: qty 1

## 2019-11-11 NOTE — ED Provider Notes (Addendum)
[facility] EMERGENCY DEPARTMENT Provider Note   CSN: [id number] Arrival date & time: 11/11/19  1032     History Chief Complaint  Patient presents with  . Fall    Catherine Weiss is a 69 y.o. female presents to the ED for evaluation of fall that occurred yesterday.  Patient states she stood up too quickly and felt lightheaded.  She fell forward up against a closet door eventually falling down onto the ground.  She twisted her right ankle and fell on her knees.  She was able to get up and walk but states over overnight her pain, swelling and bruising in right ankle have worsened.  Reports constant, moderate pain with bruising in the right lateral ankle.  Also has associated left knee pain that radiates down her shin.  Has been ambulatory with pain.  No interventions.  States she could not sleep last night due to the pain.  States in the past she has felt lightheadedness when standing up too fast and this is not new.   This has been happening "all my life".  States once she passed out but didn't go to her doctor for evaluation. She had no associated chest pain, palpitations or syncope last night prior to fall.  Has been feeling well otherwise.  No recent illnesses.  Was recently seen for another fall in the ED that required scalp staples 10 days ago, she states it was a mechanical fall.  Reports injury head fracture to her right toes many years ago but no known injury or surgeries to her right ankle.  She is on Plavix for stroke.  No headaches, head injury, loss of consciousness.  No recent illness. No vomiting, diarrhea, dysuria.   HPI     Past Medical History:  Diagnosis Date  . Arthritis   . Depression   . History of stomach ulcers   . Hypertension   . Migraines   . Stroke [facility](H[facility])     There are no problems to display for this patient.   Past Surgical History:  Procedure Laterality Date  . ABDOMINAL HYSTERECTOMY       OB History   No obstetric history on file.     No  family history on file.  Social History   Tobacco Use  . Smoking status: Former Smoker    Quit date: 02/27/2014    Years since quitting: 5.7  . Smokeless tobacco: Never Used  Vaping Use  . Vaping Use: Never used  Substance Use Topics  . Alcohol use: No  . Drug use: No    Home Medications Prior to Admission medications   Medication Sig Start Date End Date Taking? Authorizing Provider  aspirin EC 81 MG tablet Take 81 mg by mouth daily.   Yes [provider]  clopidogrel (PLAVIX) 75 MG tablet Take 75 mg by mouth daily.   Yes [provider]  dicyclomine (BENTYL) 20 MG tablet Take 1 tablet (20 mg total) by mouth 2 (two) times daily as needed for spasms. 08/17/16  Yes Loren RacerYelverton, David, MD  docusate sodium (COLACE) 100 MG capsule Take 1 capsule (100 mg total) by mouth 2 (two) times daily as needed for mild constipation or moderate constipation. 08/17/16  Yes Loren RacerYelverton, David, MD  PARoxetine (PAXIL) 20 MG tablet Take 20 mg by mouth daily.   Yes [provider]  polyethylene glycol (MIRALAX / GLYCOLAX) packet Take 17 g by mouth daily. 08/17/16  Yes Loren RacerYelverton, David, MD  topiramate (TOPAMAX) 100 MG tablet Take  100 mg by mouth 2 (two) times daily.   Yes [provider]  HYDROcodone-acetaminophen (NORCO) 5-325 MG tablet Take 1 tablet by mouth every 4 (four) hours as needed for severe pain. 02/08/17   Molpus, John, MD  SUMAtriptan (IMITREX) 50 MG tablet Take 50 mg by mouth every 2 (two) hours as needed for migraine. May repeat in 2 hours if headache persists or recurs.    [provider]  trimethoprim-polymyxin b (POLYTRIM) ophthalmic solution Place 1 drop into both eyes every 4 (four) hours. 04/11/19   Rolan Bucco, MD    Allergies    Penicillins  Review of Systems   Review of Systems  Musculoskeletal: Positive for arthralgias, gait problem and joint swelling.  All other systems reviewed and are negative.   Physical Exam Updated Vital Signs BP  109/60   Pulse 74   Temp 97.9 F (36.6 C) (Oral)   Resp 13   Ht 5' 3.5" (1.613 m)   Wt 65.8 kg   SpO2 99%   BMI 25.28 kg/m   Physical Exam Vitals and nursing note reviewed.  Constitutional:      General: She is not in acute distress.    Appearance: She is well-developed.     Comments: NAD.  HENT:     Head: Normocephalic and atraumatic.     Comments: Small healing laceration in the occipital scalp, slight focal edema and tenderness.  No bleeding, discharge.  Some scabbing/crusting over the wound.  No signs of dehiscence.    Right Ear: External ear normal.     Left Ear: External ear normal.     Nose: Nose normal.     Mouth/Throat:     Mouth: Mucous membranes are dry.     Comments: Lips dry. MM also dry.  Eyes:     General: No scleral icterus.    Conjunctiva/sclera: Conjunctivae normal.  Cardiovascular:     Rate and Rhythm: Normal rate and regular rhythm.     Heart sounds: Normal heart sounds. No murmur heard.      Comments: 1+ DP and popliteal pulses bilaterally Pulmonary:     Effort: Pulmonary effort is normal.     Breath sounds: Normal breath sounds. No wheezing.  Musculoskeletal:        General: No deformity. Normal range of motion.     Cervical back: Normal range of motion and neck supple.     Right lower leg: Tenderness present.     Comments: Left lower extremity.  Mild tenderness focally over the left patella, pain with passive flexion extension of the knee.  Normal J tracking of the patella.  No medial or lateral joint tenderness.  No focal tenderness over the quad or patellar tendons, popliteal space, tibial tuberosity.  No laxity of the joint.  Negative posterior drawer.  Upper and lower leg compartments soft, nontender.  Right lower extremity: Moderate edema, tenderness diffusely over the lateral ankle and malleoli.  Negative squeeze test.  No focal tenderness over the medial malleoli, calcaneus, Achilles, metatarsals or toes.  Patient able to flex/extend the ankle  with some pain.  Positive talar tilt.  No joint laxity.  Calf soft and nontender.  No signs of injury to the right knee.  Skin:    General: Skin is warm and dry.     Capillary Refill: Capillary refill takes less than 2 seconds.  Neurological:     Mental Status: She is alert and oriented to person, place, and time.     Comments: Sensation  and strength intact in lower extremities  Psychiatric:        Behavior: Behavior normal.        Thought Content: Thought content normal.        Judgment: Judgment normal.     ED Results / Procedures / Treatments   Labs (all labs ordered are listed, but only abnormal results are displayed) Labs Reviewed  CBC WITH DIFFERENTIAL/PLATELET - Abnormal; Notable for the following components:      Result Value   RBC 3.71 (*)    Hemoglobin 11.9 (*)    MCV 101.9 (*)    All other components within normal limits  BASIC METABOLIC PANEL - Abnormal; Notable for the following components:   BUN 39 (*)    Creatinine, Ser 1.45 (*)    GFR calc non Af Amer 36 (*)    GFR calc Af Amer 42 (*)    All other components within normal limits    EKG None  Radiology DG Tibia/Fibula Left  Result Date: 11/11/2019 CLINICAL DATA:  Left leg pain secondary to a fall at home yesterday. EXAM: LEFT TIBIA AND FIBULA - 2 VIEW COMPARISON:  None. FINDINGS: There is no evidence of fracture or other focal bone lesions. Soft tissues are unremarkable. IMPRESSION: Negative. Electronically Signed   By: Francene Boyers M.D.   On: 11/11/2019 12:31   DG Ankle Complete Right  Result Date: 11/11/2019 CLINICAL DATA:  Right ankle pain secondary to a fall last night at home. EXAM: RIGHT ANKLE - COMPLETE 3+ VIEW COMPARISON:  None. FINDINGS: There is a slightly displaced fracture of the distal fibula. Distal tibia is intact. Small ankle effusion. No dislocation. Prominent soft tissue swelling at the lateral aspect of the ankle. IMPRESSION: Slightly displaced fracture of the distal fibula. Electronically  Signed   By: Francene Boyers M.D.   On: 11/11/2019 12:29   DG Knee Complete 4 Views Left  Result Date: 11/11/2019 CLINICAL DATA:  Pain secondary to a fall last night at home. EXAM: LEFT KNEE - COMPLETE 4+ VIEW COMPARISON:  Radiographs dated 04/11/2019 FINDINGS: No evidence of fracture, dislocation, or joint effusion. No evidence of arthropathy or other focal bone abnormality. Soft tissues are unremarkable. IMPRESSION: Negative. Electronically Signed   By: Francene Boyers M.D.   On: 11/11/2019 12:30    Procedures Procedures (including critical care time)  Medications Ordered in ED Medications  oxyCODONE-acetaminophen (PERCOCET/ROXICET) 5-325 MG per tablet 1 tablet (1 tablet Oral Given 11/11/19 1231)  sodium chloride 0.9 % bolus 1,000 mL (0 mLs Intravenous Stopped 11/11/19 1514)    ED Course  I have reviewed the triage vital signs and the nursing notes.  Pertinent labs & imaging results that were available during my care of the patient were reviewed by me and considered in my medical decision making (see chart for details).  Clinical Course as of Nov 11 1543  Tue Nov 11, 2019  1235 There is a slightly displaced fracture of the distal fibula. Distal tibia is intact. Small ankle effusion. No dislocation. Prominent soft tissue swelling at the lateral aspect of the ankle.  DG Ankle Complete Right [CG]    Clinical Course User Index [CG] Jerrell Mylar   MDM Rules/Calculators/A&P                          69 year old female presents for fall with prodrome of lightheadedness.  Reports pain in the right ankle, left knee and left shin after fall.  No other injuries.  States she has had lightheadedness with standing up too quickly "all my life".  1 episode of syncope many years ago.  No red flags like chest pain, palpitation, syncope.  No recent illnesses.  No vomiting, diarrhea.   I have reviewed triage and nursing notes to assist with obtaining more history.  I have reviewed patient's  available medical records to assist with history and MDM.    In summary, she was seen approximately 10 days ago for a fall in ED.  She states that was purely mechanical and she did not have a prodrome of lightheadedness.  Seen several times in ER for falls.  Seen by cardiology for syncope and collapse in 2018.  Has had equivocal stress test done by PCP for atypical chest pain, 30 day event monitor which was normal.  Echocardiogram and nuclear stress test showed no ischemia and normal LV function.  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   I order x-rays, orthostatics.  Patient is profoundly orthostatic with drop in SBP 135>126>76 with lightheadedness.  Reports this is chronic.  She denies fluid loss recently but clinically appears dehydrated.   Will add labs, IVF, reassess, repeat orthostatics.  Cam walker for fibular fracture. Discussed with EDP.   1546: ER work-up personally reviewed and interpreted.  Mild elevation in creatinine, GFR is 36.  Electrolytes otherwise benign.  Minimal anemia.  EKG without ischemia, arrhythmias.  On cardiac monitor since 1030 without events.   Reevaluated patient.  Reports improvement in pain in the leg. IVF finished. Will repeat orthostatics.   1640: Orthostatics repeated.  SBP 131>134>105, patient reported feeling light headed and sick.  Has not eaten anything today.  Will give another liter IVF. Will offer food. Patient again states long history of light headedness, passed out once in 2014 when she had a stroke. Complete neurological exam done and normal. Will give another IVF liter, reassess.  Consider admission for orthostasis. She is at risk for fall. May benefit from IVF overnight.     1845: Orthostatics normalized.  Asymptomatic.  Patient tolerating fluids here.  Given clinical improvement, recent cardiac work up for syncope and otherwise benign work up here apprpriate for discharge with PCP follow up.  Ortho contact given. Return  precautions given.   Patient requested refill of hydrocodone.  States in the past her neurologist has prescribed these to her.  Initially told me she did not have anymore and her last refill was months ago.  I reviewed narcotic database and she recently picked up 30 days of hydrocodone on 6/4 by a physician that prescribes this medicine to her frequently.  I asked her about this, states she had to take "extra" for her scalp pain from her fall.  Will not prescribe a second prescription for hydrocodone, I have asked her to contact her main prescriber for refills.  Tylenol for pain. Final Clinical Impression(s) / ED Diagnoses Final diagnoses:  Fall, initial encounter  Other closed fracture of distal end of right fibula, initial encounter  Orthostatic lightheadedness    Rx / DC Orders ED Discharge Orders    None         Jerrell Mylar 11/11/19 1903    Benjiman Core, MD 11/12/19 [phone]

## 2019-11-11 NOTE — ED Triage Notes (Signed)
Pt fell yesterday while opening a closet door at  Home.  Does not know why she fell.  Sts she falls a lot.  Gets light-headed and falls.  Pain in both lower legs and right ankle. Sts she didn't sleep all night due to the pain.

## 2019-11-11 NOTE — Discharge Instructions (Addendum)
You were seen in the ED after a fall  You have a fracture on your fibula. You need to wear your cam walker at all times. Please call orthopedist to make an appointment for reevaluation in the next 7 to 10 days. Elevate your foot. Take [phone] mg of acetaminophen every 6 hours. Ice over your cam walker.  You reported lightheadedness prior to your fall. Your blood pressure dropped with standing here in the ED. We gave you 2 L of IV fluids and your blood pressure normalized. I suspect this was from dehydration. You have had cardiac work-up for lightheadedness and passing out episodes by cardiology and your primary care doctor.   Stay hydrated   Please call your primary care doctor in the next week for reevaluation and to ensure your lightheadedness has improved.  Return to the ED for recurrent lightheadedness, passing on, recurrent falls, chest pain, shortness of breath, palpitations

## 2019-11-11 NOTE — ED Notes (Signed)
Water, crackers, and baked chips given.

## 2020-02-05 ENCOUNTER — Other Ambulatory Visit: Payer: Self-pay | Admitting: Orthopedic Surgery

## 2020-02-05 DIAGNOSIS — S8264XA Nondisplaced fracture of lateral malleolus of right fibula, initial encounter for closed fracture: Secondary | ICD-10-CM

## 2020-02-05 DIAGNOSIS — M25562 Pain in left knee: Secondary | ICD-10-CM

## 2020-02-05 DIAGNOSIS — M25561 Pain in right knee: Secondary | ICD-10-CM

## 2020-07-15 ENCOUNTER — Emergency Department (HOSPITAL_BASED_OUTPATIENT_CLINIC_OR_DEPARTMENT_OTHER)
Admission: EM | Admit: 2020-07-15 | Discharge: 2020-07-15 | Disposition: A | Discharge status: Home / Self Care | Payer: Medicare HMO | Attending: Emergency Medicine | Admitting: Emergency Medicine

## 2020-07-15 ENCOUNTER — Emergency Department (HOSPITAL_BASED_OUTPATIENT_CLINIC_OR_DEPARTMENT_OTHER): Payer: Medicare HMO

## 2020-07-15 ENCOUNTER — Other Ambulatory Visit: Payer: Self-pay

## 2020-07-15 ENCOUNTER — Encounter (HOSPITAL_BASED_OUTPATIENT_CLINIC_OR_DEPARTMENT_OTHER): Payer: Self-pay | Admitting: *Deleted

## 2020-07-15 DIAGNOSIS — Z87891 Personal history of nicotine dependence: Secondary | ICD-10-CM | POA: Diagnosis not present

## 2020-07-15 DIAGNOSIS — Z7982 Long term (current) use of aspirin: Secondary | ICD-10-CM | POA: Insufficient documentation

## 2020-07-15 DIAGNOSIS — Z8673 Personal history of transient ischemic attack (TIA), and cerebral infarction without residual deficits: Secondary | ICD-10-CM | POA: Insufficient documentation

## 2020-07-15 DIAGNOSIS — Z79899 Other long term (current) drug therapy: Secondary | ICD-10-CM | POA: Diagnosis not present

## 2020-07-15 DIAGNOSIS — I10 Essential (primary) hypertension: Secondary | ICD-10-CM | POA: Insufficient documentation

## 2020-07-15 DIAGNOSIS — M25571 Pain in right ankle and joints of right foot: Secondary | ICD-10-CM | POA: Diagnosis present

## 2020-07-15 DIAGNOSIS — M79671 Pain in right foot: Secondary | ICD-10-CM

## 2020-07-15 MED ORDER — LIDOCAINE 5 % EX PTCH: 1.0000 | MEDICATED_PATCH | CUTANEOUS | 0 refills | Status: AC

## 2020-07-15 NOTE — Discharge Instructions (Addendum)
You came to the emergency department today to be evaluated for your right ankle and foot pain.  Your physical exam was reassuring.  Your x-ray showed no broken bones or dislocations.  I have given you a cam walking boot.  Please wear this when walking.  When resting at home you may remove the boot.  Please rest, elevate, and ice your ankle.  Please go to your orthopedic physician appointment on Monday.  You may use over-the-counter pain medication to help with your pain.  Have also given you prescription for lidocaine patches.  Get help right away if: Your foot is numb or tingling. Your foot or toes are swollen. Your foot or toes turn white or blue. You have warmth and redness along your foot.

## 2020-07-15 NOTE — ED Triage Notes (Signed)
Right foot pain. No known injury. States she broke her ankle a couple of months ago.

## 2020-07-15 NOTE — ED Provider Notes (Signed)
[facility] EMERGENCY DEPARTMENT Provider Note   CSN: [id number] Arrival date & time: 07/15/20  1238     History Chief Complaint  Patient presents with  . Foot Pain    Catherine Weiss is a 70 y.o. female migraines, hypertension, CVA, arthritis and depression.  Patient presents with chief complaint of right foot pain.  States that the pain is located to the dorsum of her foot and radiates into her lower extremity with ambulation.  She reports that her pain began 1 week prior.  Patient reports that her pain has been constant worsened over this time.  Patient rates her pain as a 10/10 on the pain scale and describes it as a "throbbing."  Patient reports that her pain is worse with ambulation.  Patient denies any alleviating factors.  Believes she might have rolled her ankle.  Denies any falls.  She endorses swelling to her foot and ankle.  Denies any color change or rash.  HPI     Past Medical History:  Diagnosis Date  . Arthritis   . Depression   . History of stomach ulcers   . Hypertension   . Migraines   . Stroke [facility])     There are no problems to display for this patient.   Past Surgical History:  Procedure Laterality Date  . ABDOMINAL HYSTERECTOMY       OB History   No obstetric history on file.     No family history on file.  Social History   Tobacco Use  . Smoking status: Former Smoker    Quit date: 02/27/2014    Years since quitting: 6.3  . Smokeless tobacco: Never Used  Vaping Use  . Vaping Use: Never used  Substance Use Topics  . Alcohol use: No  . Drug use: No    Home Medications Prior to Admission medications   Medication Sig Start Date End Date Taking? Authorizing Provider  aspirin EC 81 MG tablet Take 81 mg by mouth daily.    [provider]  clopidogrel (PLAVIX) 75 MG tablet Take 75 mg by mouth daily.    [provider]  dicyclomine (BENTYL) 20 MG tablet Take 1 tablet (20 mg total) by mouth 2 (two) times daily as  needed for spasms. 08/17/16   Loren Racer, MD  docusate sodium (COLACE) 100 MG capsule Take 1 capsule (100 mg total) by mouth 2 (two) times daily as needed for mild constipation or moderate constipation. 08/17/16   Loren Racer, MD  HYDROcodone-acetaminophen (NORCO) 5-325 MG tablet Take 1 tablet by mouth every 4 (four) hours as needed for severe pain. 02/08/17   Molpus, John, MD  PARoxetine (PAXIL) 20 MG tablet Take 20 mg by mouth daily.    [provider]  polyethylene glycol (MIRALAX / GLYCOLAX) packet Take 17 g by mouth daily. 08/17/16   Loren Racer, MD  SUMAtriptan (IMITREX) 50 MG tablet Take 50 mg by mouth every 2 (two) hours as needed for migraine. May repeat in 2 hours if headache persists or recurs.    [provider]  topiramate (TOPAMAX) 100 MG tablet Take 100 mg by mouth 2 (two) times daily.    [provider]  trimethoprim-polymyxin b (POLYTRIM) ophthalmic solution Place 1 drop into both eyes every 4 (four) hours. 04/11/19   Rolan Bucco, MD    Allergies    Penicillins  Review of Systems   Review of Systems  Cardiovascular: Negative for leg swelling.  Musculoskeletal: Negative for back pain and neck pain.  Skin: Negative for color change, pallor, rash and wound.  Neurological: Negative for weakness and numbness.    Physical Exam Updated Vital Signs BP 138/74 (BP Location: Left Arm)   Pulse (!) 103   Temp 98.1 F (36.7 C) (Oral)   Resp 17   Ht 5' 3.5" (1.613 m)   Wt 65.8 kg   SpO2 99%   BMI 25.29 kg/m   Physical Exam Vitals and nursing note reviewed.  Constitutional:      General: She is not in acute distress.    Appearance: She is not ill-appearing, toxic-appearing or diaphoretic.  HENT:     Head: Normocephalic.  Eyes:     General: No scleral icterus.       Right eye: No discharge.        Left eye: No discharge.  Cardiovascular:     Rate and Rhythm: Normal rate.     Pulses:          Dorsalis pedis pulses are 3+ on the  right side.  Pulmonary:     Effort: Pulmonary effort is normal.  Musculoskeletal:     Right lower leg: No swelling, deformity, lacerations, tenderness or bony tenderness. No edema.     Right ankle: Swelling (lateral aspect) present. No deformity, ecchymosis or lacerations. Tenderness present over the lateral malleolus. Normal range of motion. Normal pulse.     Right Achilles Tendon: No tenderness or defects.     Right foot: Normal range of motion and normal capillary refill. Tenderness (dorsum) present. No swelling, deformity, laceration or bony tenderness. Normal pulse.     Comments: Minimal swelling noted to lateral aspect of right ankle just below lateral malleolus  No erythema, wounds, or rash noted to right foot, ankle or lower extremity  Feet:     Right foot:     Skin integrity: No ulcer, blister, skin breakdown, erythema, warmth, callus, dry skin or fissure.     Toenail Condition: Right toenails are normal.  Skin:    General: Skin is warm and dry.  Neurological:     General: No focal deficit present.     Mental Status: She is alert.  Psychiatric:        Behavior: Behavior is cooperative.     ED Results / Procedures / Treatments   Labs (all labs ordered are listed, but only abnormal results are displayed) Labs Reviewed - No data to display  EKG None  Radiology DG Ankle Complete Right  Result Date: 07/15/2020 CLINICAL DATA:  Acute right ankle pain. EXAM: RIGHT ANKLE - COMPLETE 3+ VIEW COMPARISON:  November 11, 2019. FINDINGS: Old distal right fibular fracture is noted. No acute abnormality is noted. No soft tissue abnormality is noted IMPRESSION: Old distal right fibular fracture. No acute abnormality is noted. Electronically Signed   By: Lupita Raider M.D.   On: 07/15/2020 14:47   DG Foot Complete Right  Result Date: 07/15/2020 CLINICAL DATA:  Pain laterally for 1 week EXAM: RIGHT FOOT COMPLETE - 3+ VIEW COMPARISON:  None. FINDINGS: Old healed third metatarsal fracture is  noted. No acute fracture is seen. Changes of prior fracture with healing in the distal fibula are noted. No soft tissue abnormality is noted. IMPRESSION: Chronic changes without acute abnormality. Electronically Signed   By: Alcide Clever M.D.   On: 07/15/2020 14:51    Procedures Procedures   Medications Ordered in ED Medications - No data to display  ED Course  I have reviewed the triage vital signs and the  nursing notes.  Pertinent labs & imaging results that were available during my care of the patient were reviewed by me and considered in my medical decision making (see chart for details).    MDM Rules/Calculators/A&P                          Alert 70 year old female no acute distress, nontoxic appearing.  Patient presents with chief complaint of right ankle pain.  Patient denies any traumatic injuries or falls.  Patient believes she may rolled her ankle.    Minimal swelling noted to lateral aspect of right ankle just below lateral malleolus.  No erythema, wounds, or rash noted to right foot, ankle or lower extremity.  +3 dorsalis pedis pulse 2 right foot, motor and sensation intact to right foot.  X-ray right ankle showed old distal right fibular fracture; no acute abnormality noted. X-ray right foot showed old healed third metatarsal fracture; no acute abnormality noted.  Patient was offered Tylenol for pain management however refused to do reports that it makes her stomach upset. Patient states that she has appointment with her orthopedic physician on Monday.  Will place patient in cam walker boot and have her follow-up with this appointment on Monday.  Patient prescribed lidocaine patch.  Patient advised to rest, elevate, and ice ankle and foot. Discussed results, findings, treatment and follow up. Patient advised of return precautions. Patient verbalized understanding and agreed with plan.   Final Clinical Impression(s) / ED Diagnoses Final diagnoses:  Foot pain, right  Pain  in joint involving right ankle and foot    Rx / DC Orders ED Discharge Orders         Ordered    lidocaine (LIDODERM) 5 %  Every 24 hours        07/15/20 1528           Berneice Heinrich 07/15/20 2159    Pricilla Loveless, MD 07/16/20 [phone]

## 2022-04-06 IMAGING — DX DG KNEE COMPLETE 4+V*L*
4 series · 4 of 4 positions shown · non-contrast
Comparison: Radiographs dated 04/11/2019

CLINICAL DATA: Pain secondary to a fall last night at home.

EXAM:
LEFT KNEE - COMPLETE 4+ VIEW

[knee ap]
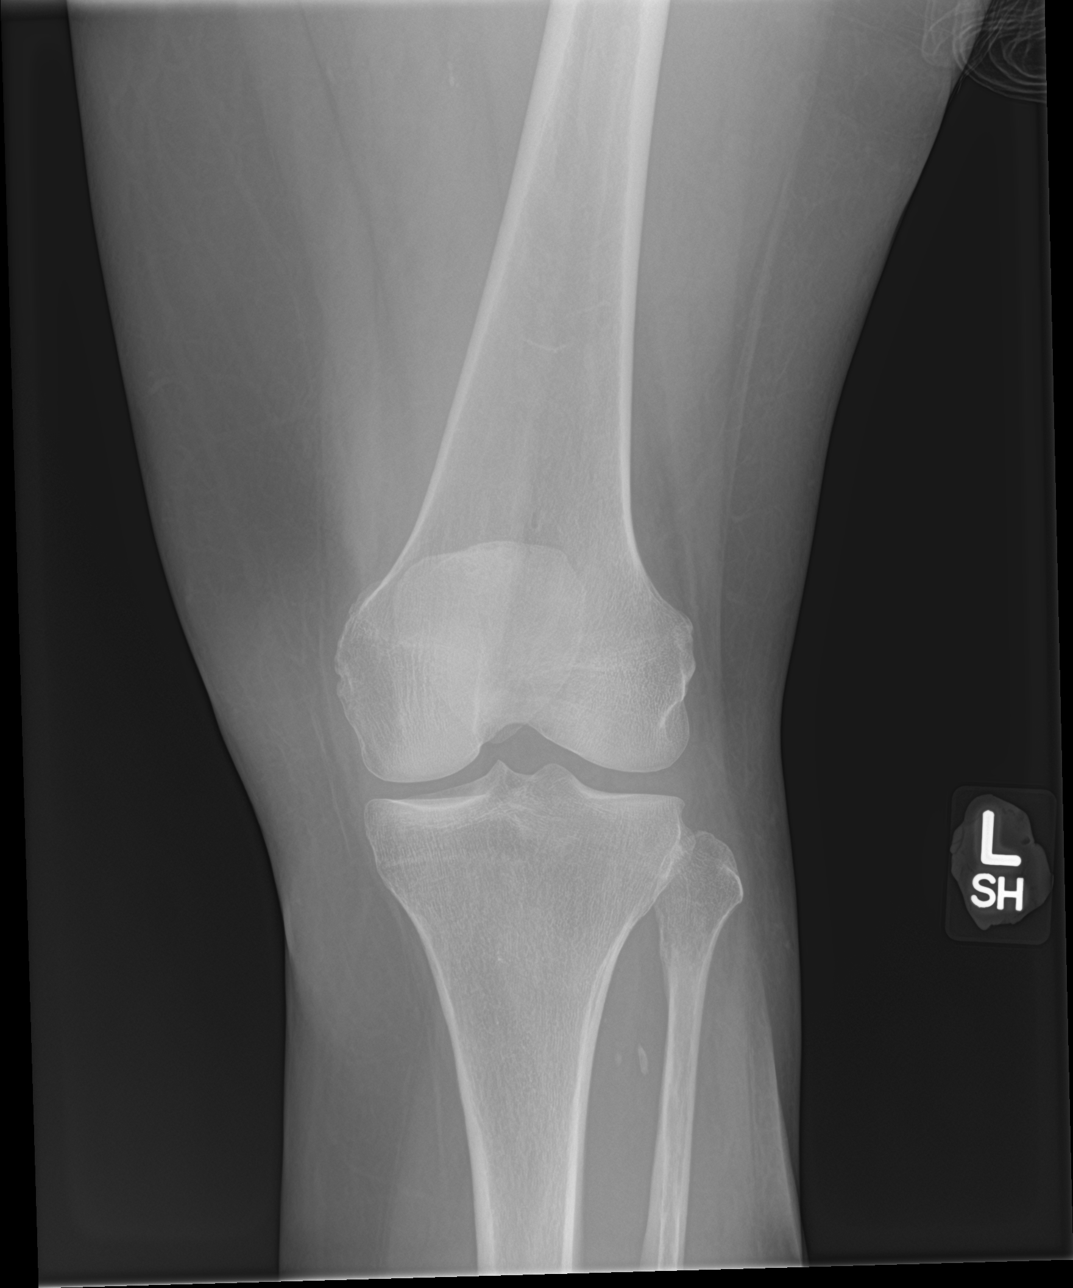

[knee lat]
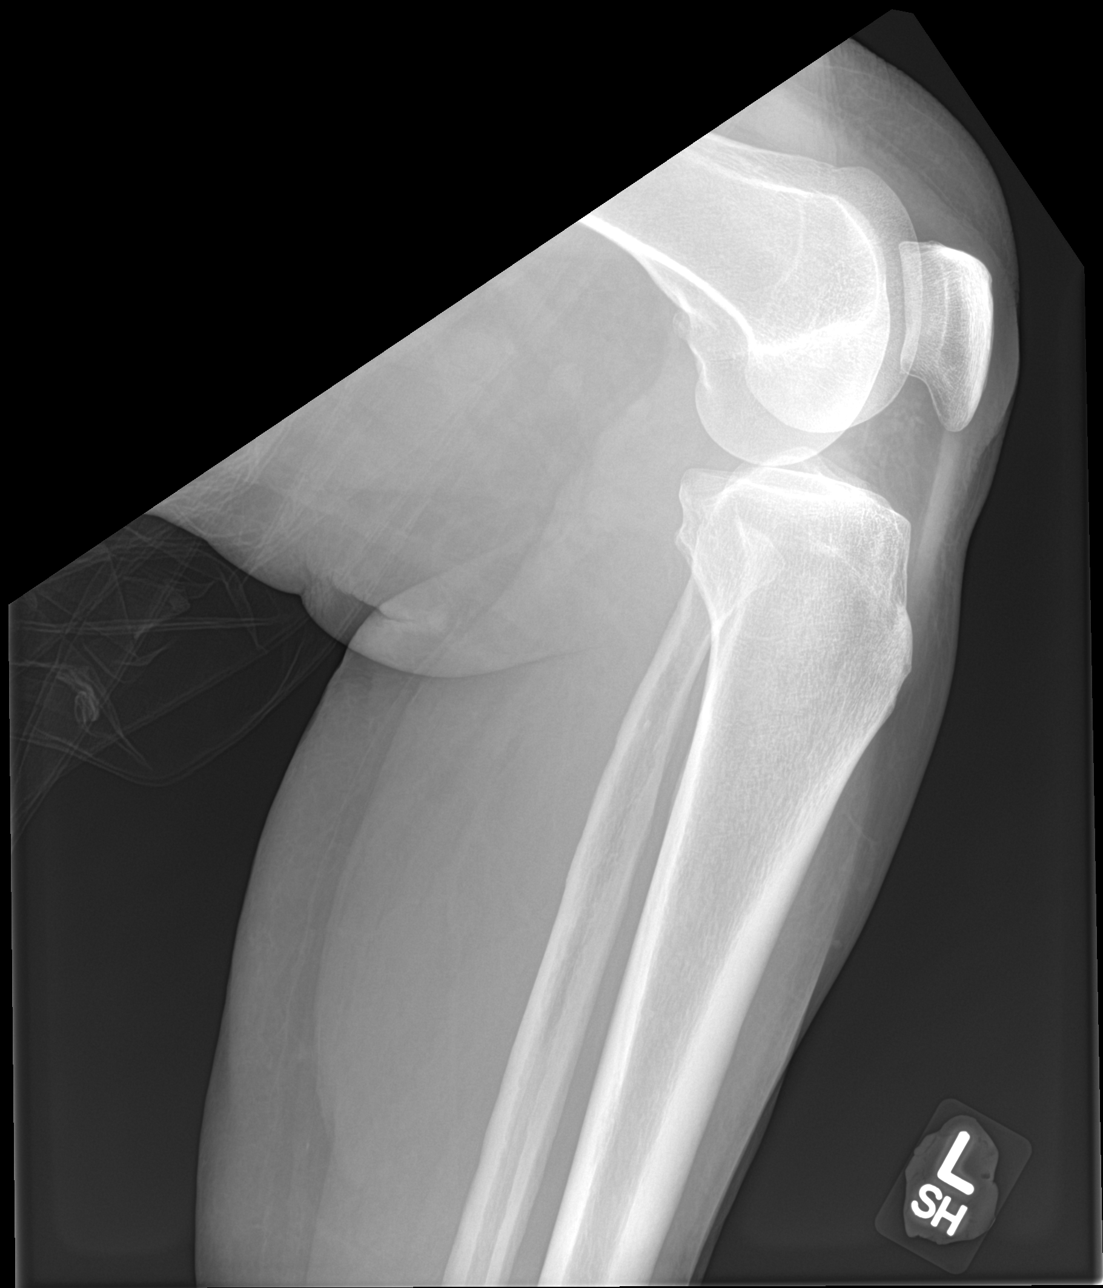

[knee obl (1 of 2)]
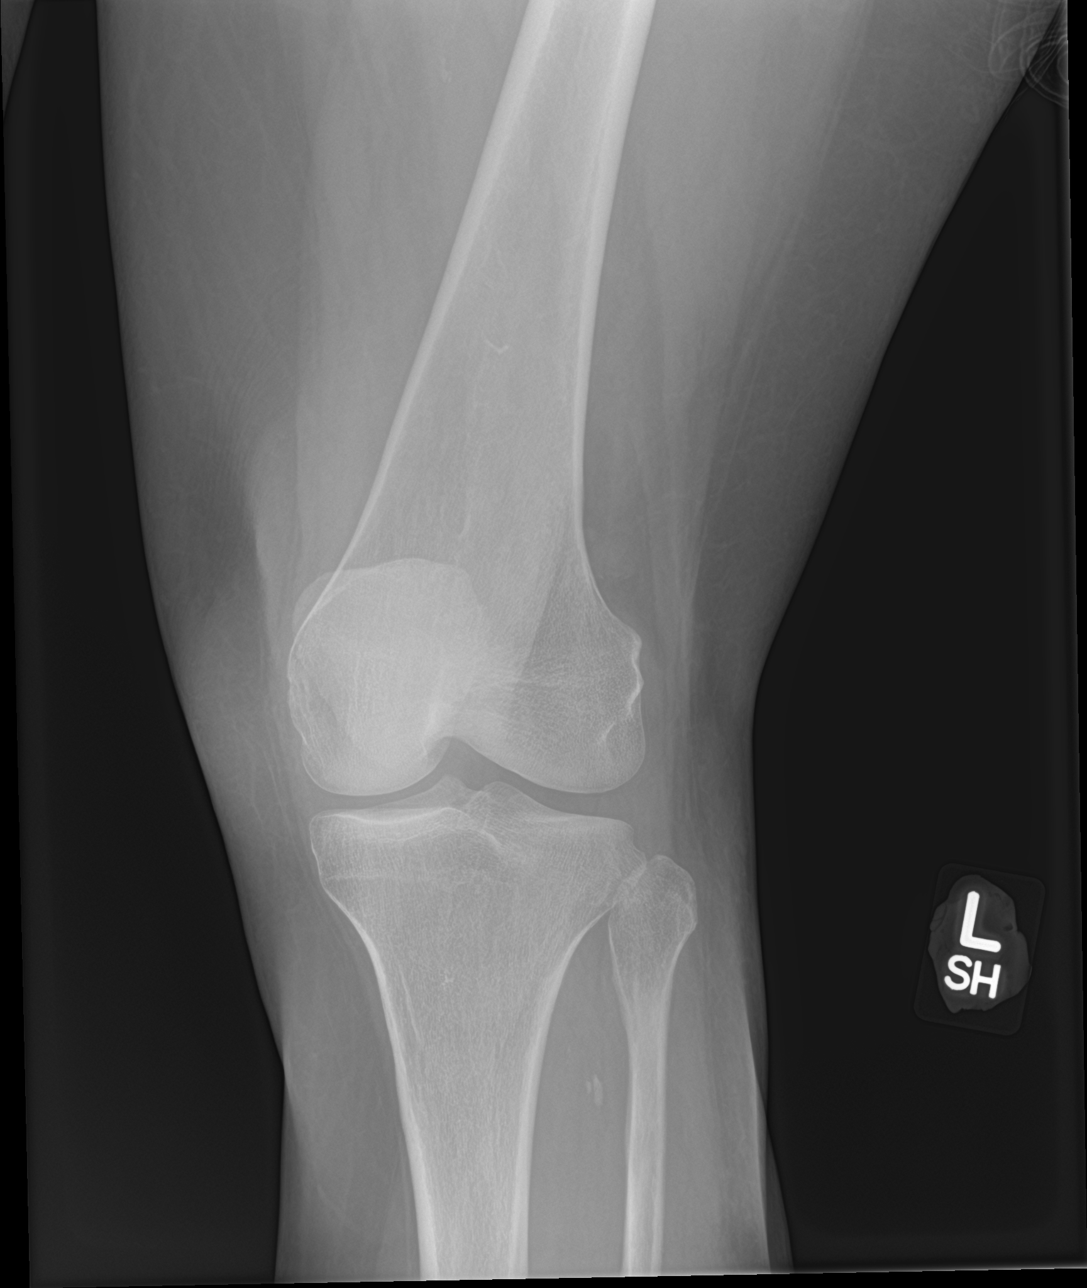

[knee obl (2 of 2)]
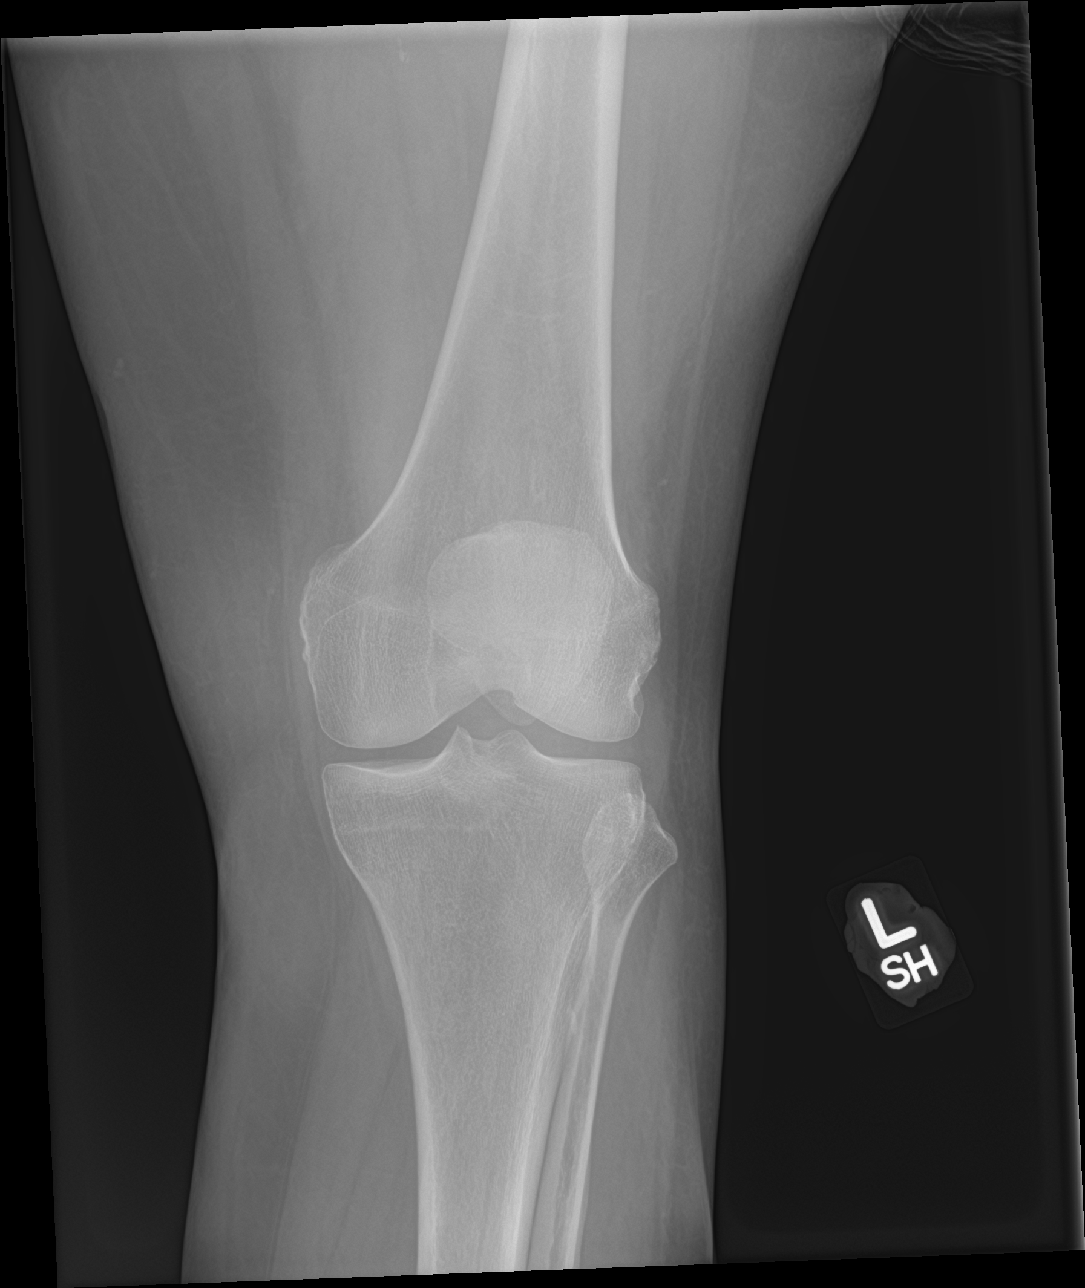

[4 of 4 positions shown; findings below may reference images not displayed]

FINDINGS: No evidence of fracture, dislocation, or joint effusion. No evidence
of arthropathy or other focal bone abnormality. Soft tissues are
unremarkable.
IMPRESSION: Negative.

## 2022-12-09 IMAGING — DX DG FOOT COMPLETE 3+V*R*
3 series · 3 of 3 positions shown · non-contrast
Comparison: None.

CLINICAL DATA: Pain laterally for 1 week

EXAM:
RIGHT FOOT COMPLETE - 3+ VIEW

[foot ap]
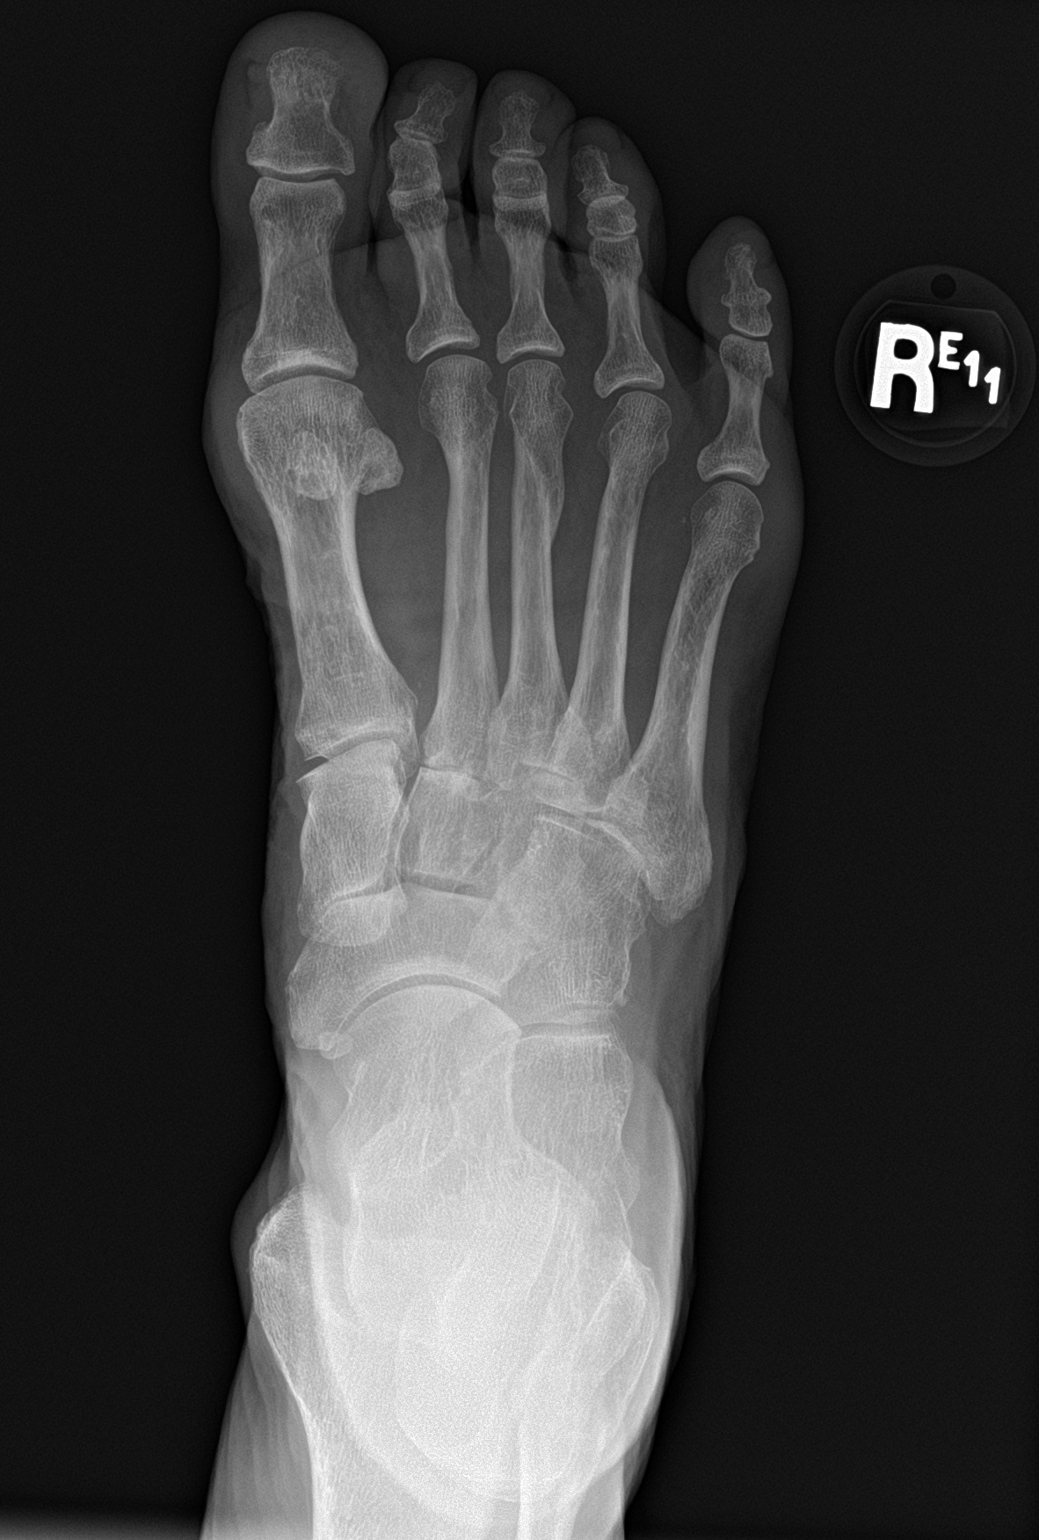

[foot obl]
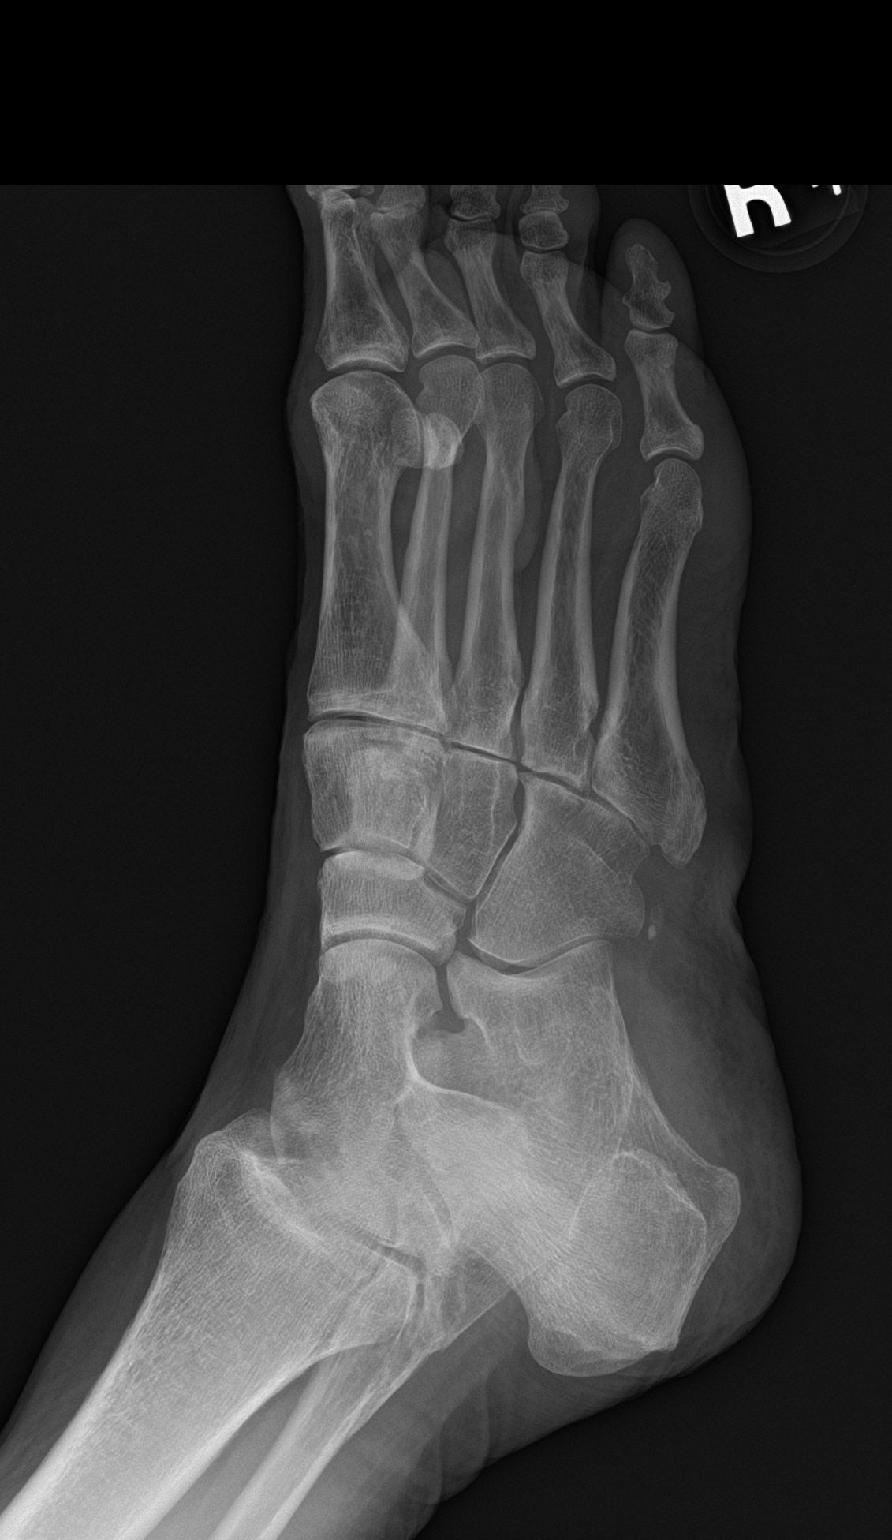

[foot lat]
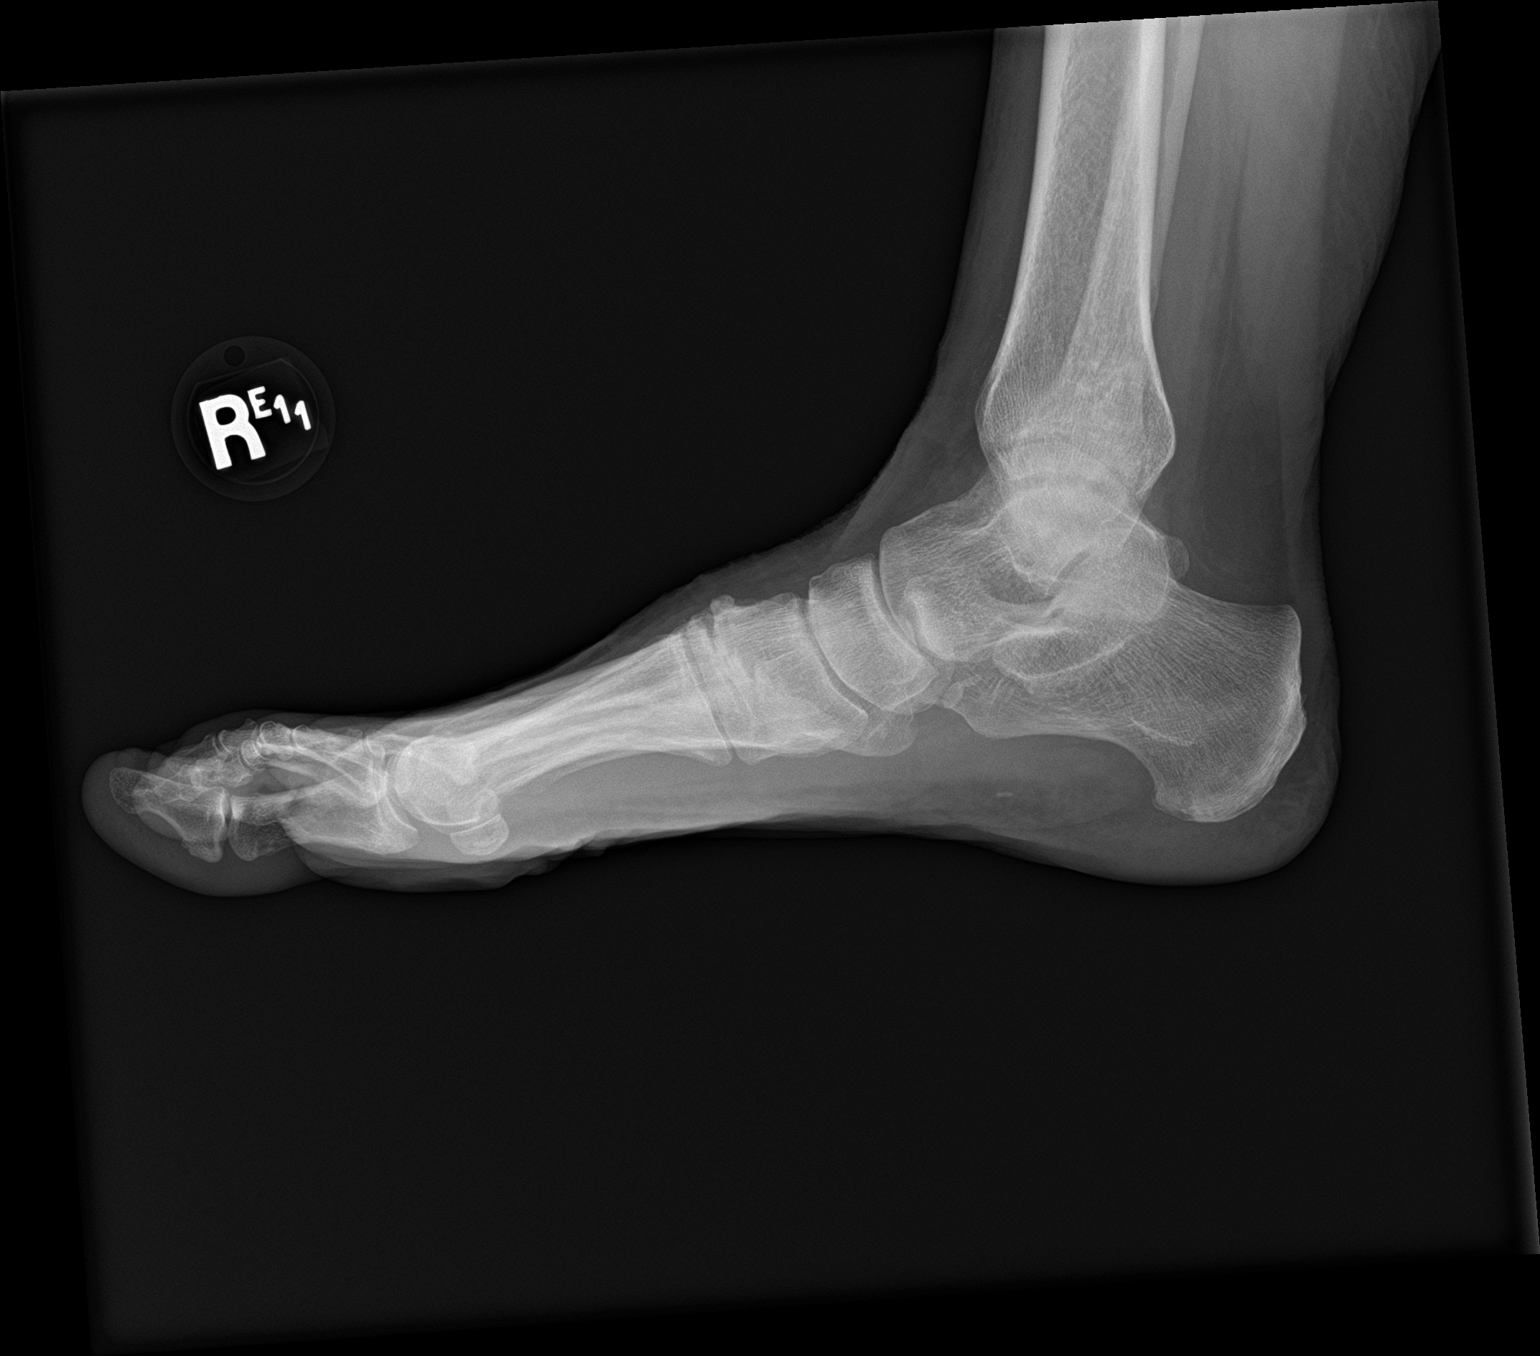

[3 of 3 positions shown; findings below may reference images not displayed]

FINDINGS: Old healed third metatarsal fracture is noted. No acute fracture is
seen. Changes of prior fracture with healing in the distal fibula
are noted. No soft tissue abnormality is noted.
IMPRESSION: Chronic changes without acute abnormality.

## 2023-01-14 DEATH — deceased
